# Patient Record
Sex: Male | Born: 1980 | Race: White | Hispanic: No | Marital: Married | Smoking: Current some day smoker
Health system: Southern US, Community
[De-identification: ages and names within clinical notes are randomized; demographics above are authoritative.]

## PROBLEM LIST (undated history)

## (undated) DIAGNOSIS — M5416 Radiculopathy, lumbar region: Secondary | ICD-10-CM

## (undated) DIAGNOSIS — F17200 Nicotine dependence, unspecified, uncomplicated: Secondary | ICD-10-CM

## (undated) DIAGNOSIS — K5790 Diverticulosis of intestine, part unspecified, without perforation or abscess without bleeding: Secondary | ICD-10-CM

## (undated) DIAGNOSIS — R892 Abnormal level of other drugs, medicaments and biological substances in specimens from other organs, systems and tissues: Secondary | ICD-10-CM

## (undated) DIAGNOSIS — K760 Fatty (change of) liver, not elsewhere classified: Secondary | ICD-10-CM

## (undated) DIAGNOSIS — G8929 Other chronic pain: Secondary | ICD-10-CM

## (undated) DIAGNOSIS — Z1810 Retained metal fragments, unspecified: Secondary | ICD-10-CM

## (undated) DIAGNOSIS — K529 Noninfective gastroenteritis and colitis, unspecified: Secondary | ICD-10-CM

## (undated) DIAGNOSIS — I10 Essential (primary) hypertension: Secondary | ICD-10-CM

## (undated) DIAGNOSIS — Z860100 Personal history of colon polyps, unspecified: Secondary | ICD-10-CM

## (undated) DIAGNOSIS — E785 Hyperlipidemia, unspecified: Secondary | ICD-10-CM

## (undated) DIAGNOSIS — G894 Chronic pain syndrome: Secondary | ICD-10-CM

## (undated) DIAGNOSIS — Z8669 Personal history of other diseases of the nervous system and sense organs: Secondary | ICD-10-CM

## (undated) HISTORY — DX: Fatty (change of) liver, not elsewhere classified: K76.0

## (undated) HISTORY — PX: DISCOGRAM: SHX1460

## (undated) HISTORY — DX: Personal history of colon polyps, unspecified: Z86.0100

## (undated) HISTORY — DX: Hyperlipidemia, unspecified: E78.5

## (undated) HISTORY — DX: Essential (primary) hypertension: I10

## (undated) HISTORY — DX: Nicotine dependence, unspecified, uncomplicated: F17.200

## (undated) HISTORY — DX: Personal history of other diseases of the nervous system and sense organs: Z86.69

## (undated) HISTORY — DX: Personal history of colonic polyps: Z86.010

## (undated) HISTORY — DX: Abnormal level of other drugs, medicaments and biological substances in specimens from other organs, systems and tissues: R89.2

## (undated) HISTORY — DX: Other chronic pain: G89.29

## (undated) HISTORY — DX: Chronic pain syndrome: G89.4

## (undated) HISTORY — DX: Retained metal fragments, unspecified: Z18.10

## (undated) HISTORY — DX: Diverticulosis of intestine, part unspecified, without perforation or abscess without bleeding: K57.90

## (undated) HISTORY — DX: Radiculopathy, lumbar region: M54.16

## (undated) HISTORY — DX: Noninfective gastroenteritis and colitis, unspecified: K52.9

---

## 2008-06-03 DIAGNOSIS — G894 Chronic pain syndrome: Secondary | ICD-10-CM

## 2008-06-03 DIAGNOSIS — G8929 Other chronic pain: Secondary | ICD-10-CM

## 2008-06-03 HISTORY — DX: Chronic pain syndrome: G89.4

## 2008-06-03 HISTORY — DX: Other chronic pain: G89.29

## 2010-06-03 HISTORY — PX: OTHER SURGICAL HISTORY: SHX169

## 2011-06-04 HISTORY — PX: APPENDECTOMY: SHX54

## 2012-03-03 DIAGNOSIS — K760 Fatty (change of) liver, not elsewhere classified: Secondary | ICD-10-CM

## 2012-03-03 DIAGNOSIS — K5790 Diverticulosis of intestine, part unspecified, without perforation or abscess without bleeding: Secondary | ICD-10-CM

## 2012-03-03 DIAGNOSIS — Z1810 Retained metal fragments, unspecified: Secondary | ICD-10-CM

## 2012-03-03 HISTORY — DX: Diverticulosis of intestine, part unspecified, without perforation or abscess without bleeding: K57.90

## 2012-03-03 HISTORY — DX: Retained metal fragments, unspecified: Z18.10

## 2012-03-03 HISTORY — DX: Fatty (change of) liver, not elsewhere classified: K76.0

## 2012-05-31 ENCOUNTER — Emergency Department: Payer: Self-pay | Admitting: Emergency Medicine

## 2012-05-31 LAB — URINALYSIS, COMPLETE
Bacteria: NONE SEEN
Bilirubin,UR: NEGATIVE
Blood: NEGATIVE
Glucose,UR: NEGATIVE mg/dL (ref 0–75)
Ketone: NEGATIVE
Leukocyte Esterase: NEGATIVE
Ph: 7 (ref 4.5–8.0)
Specific Gravity: 1.002 (ref 1.003–1.030)
Squamous Epithelial: NONE SEEN

## 2012-05-31 LAB — CBC
HCT: 44.1 % (ref 40.0–52.0)
MCH: 31.5 pg (ref 26.0–34.0)
MCHC: 35.4 g/dL (ref 32.0–36.0)
MCV: 89 fL (ref 80–100)
Platelet: 174 10*3/uL (ref 150–440)
RBC: 4.95 10*6/uL (ref 4.40–5.90)

## 2012-05-31 LAB — COMPREHENSIVE METABOLIC PANEL
Albumin: 4.3 g/dL (ref 3.4–5.0)
Anion Gap: 6 — ABNORMAL LOW (ref 7–16)
BUN: 11 mg/dL (ref 7–18)
Calcium, Total: 8.7 mg/dL (ref 8.5–10.1)
Co2: 28 mmol/L (ref 21–32)
EGFR (African American): >60
EGFR (Non-African Amer.): >60
Glucose: 92 mg/dL (ref 65–99)
Potassium: 4 mmol/L (ref 3.5–5.1)
SGOT(AST): 103 U/L — ABNORMAL HIGH (ref 15–37)
Sodium: 140 mmol/L (ref 136–145)

## 2012-05-31 LAB — LIPASE, BLOOD: Lipase: 124 U/L (ref 73–393)

## 2012-06-03 HISTORY — PX: COLONOSCOPY: SHX174

## 2012-07-09 ENCOUNTER — Emergency Department: Payer: Self-pay | Admitting: Emergency Medicine

## 2012-07-09 LAB — URINALYSIS, COMPLETE
Bacteria: NONE SEEN
Bilirubin,UR: NEGATIVE
Glucose,UR: NEGATIVE mg/dL (ref 0–75)
Ketone: NEGATIVE
Nitrite: NEGATIVE
Ph: 6 (ref 4.5–8.0)
Protein: NEGATIVE
RBC,UR: <1 /HPF (ref 0–5)
Specific Gravity: 1.009 (ref 1.003–1.030)
Squamous Epithelial: NONE SEEN
WBC UR: <1 /HPF (ref 0–5)

## 2012-07-09 LAB — CBC WITH DIFFERENTIAL/PLATELET
Basophil #: 0 10*3/uL (ref 0.0–0.1)
Eosinophil #: 0.1 10*3/uL (ref 0.0–0.7)
Eosinophil %: 1.2 %
Lymphocyte #: 1.9 10*3/uL (ref 1.0–3.6)
Lymphocyte %: 28 %
MCH: 30 pg (ref 26.0–34.0)
MCHC: 33.4 g/dL (ref 32.0–36.0)
MCV: 90 fL (ref 80–100)
Neutrophil #: 4.5 10*3/uL (ref 1.4–6.5)
Platelet: 190 10*3/uL (ref 150–440)
WBC: 6.8 10*3/uL (ref 3.8–10.6)

## 2012-07-09 LAB — COMPREHENSIVE METABOLIC PANEL
Alkaline Phosphatase: 82 U/L (ref 50–136)
Anion Gap: 5 — ABNORMAL LOW (ref 7–16)
BUN: 8 mg/dL (ref 7–18)
Bilirubin,Total: 0.4 mg/dL (ref 0.2–1.0)
Calcium, Total: 8.9 mg/dL (ref 8.5–10.1)
Chloride: 108 mmol/L — ABNORMAL HIGH (ref 98–107)
EGFR (African American): >60
EGFR (Non-African Amer.): >60
Glucose: 96 mg/dL (ref 65–99)
Osmolality: 278 (ref 275–301)
Potassium: 4 mmol/L (ref 3.5–5.1)
SGOT(AST): 30 U/L (ref 15–37)
SGPT (ALT): 57 U/L (ref 12–78)
Sodium: 140 mmol/L (ref 136–145)

## 2012-10-10 ENCOUNTER — Emergency Department: Payer: Self-pay | Admitting: Emergency Medicine

## 2012-10-10 LAB — URINALYSIS, COMPLETE
Bacteria: NONE SEEN
Bilirubin,UR: NEGATIVE
Blood: NEGATIVE
Glucose,UR: NEGATIVE mg/dL (ref 0–75)
Ketone: NEGATIVE
Leukocyte Esterase: NEGATIVE
Nitrite: NEGATIVE
Ph: 5 (ref 4.5–8.0)
Protein: NEGATIVE
RBC,UR: NONE SEEN /HPF (ref 0–5)
Specific Gravity: 1.014 (ref 1.003–1.030)
Squamous Epithelial: <1
WBC UR: <1 /HPF (ref 0–5)

## 2012-10-10 LAB — CBC
HCT: 44.7 % (ref 40.0–52.0)
MCH: 30.4 pg (ref 26.0–34.0)
MCHC: 34.7 g/dL (ref 32.0–36.0)
MCV: 87 fL (ref 80–100)
RDW: 12.7 % (ref 11.5–14.5)
WBC: 6.7 10*3/uL (ref 3.8–10.6)

## 2012-10-10 LAB — COMPREHENSIVE METABOLIC PANEL
Albumin: 4.3 g/dL (ref 3.4–5.0)
BUN: 10 mg/dL (ref 7–18)
Bilirubin,Total: 0.2 mg/dL (ref 0.2–1.0)
Calcium, Total: 9.3 mg/dL (ref 8.5–10.1)
Chloride: 105 mmol/L (ref 98–107)
EGFR (African American): >60
EGFR (Non-African Amer.): >60
Osmolality: 275 (ref 275–301)
Potassium: 3.9 mmol/L (ref 3.5–5.1)
Total Protein: 8 g/dL (ref 6.4–8.2)

## 2012-10-10 LAB — LIPASE, BLOOD: Lipase: 127 U/L (ref 73–393)

## 2012-11-01 ENCOUNTER — Emergency Department: Payer: Self-pay | Admitting: Emergency Medicine

## 2013-01-02 ENCOUNTER — Emergency Department: Payer: Self-pay | Admitting: Emergency Medicine

## 2013-01-02 LAB — COMPREHENSIVE METABOLIC PANEL
Albumin: 4.4 g/dL (ref 3.4–5.0)
Anion Gap: 6 — ABNORMAL LOW (ref 7–16)
BUN: 8 mg/dL (ref 7–18)
Bilirubin,Total: 0.3 mg/dL (ref 0.2–1.0)
Chloride: 105 mmol/L (ref 98–107)
Co2: 28 mmol/L (ref 21–32)
EGFR (African American): >60
EGFR (Non-African Amer.): >60
Glucose: 103 mg/dL — ABNORMAL HIGH (ref 65–99)
Osmolality: 276 (ref 275–301)
Potassium: 3.8 mmol/L (ref 3.5–5.1)
Sodium: 139 mmol/L (ref 136–145)
Total Protein: 8.5 g/dL — ABNORMAL HIGH (ref 6.4–8.2)

## 2013-01-02 LAB — CBC
HCT: 46.8 % (ref 40.0–52.0)
HGB: 16.4 g/dL (ref 13.0–18.0)
MCH: 30.6 pg (ref 26.0–34.0)
MCHC: 35.2 g/dL (ref 32.0–36.0)
Platelet: 214 10*3/uL (ref 150–440)
RBC: 5.38 10*6/uL (ref 4.40–5.90)
RDW: 12.8 % (ref 11.5–14.5)
WBC: 9 10*3/uL (ref 3.8–10.6)

## 2013-04-19 ENCOUNTER — Emergency Department: Payer: Self-pay | Admitting: Emergency Medicine

## 2013-04-19 LAB — CBC WITH DIFFERENTIAL/PLATELET
Basophil #: 0.1 10*3/uL (ref 0.0–0.1)
HGB: 16.1 g/dL (ref 13.0–18.0)
Lymphocyte %: 24.9 %
MCV: 88 fL (ref 80–100)
Monocyte #: 0.3 x10 3/mm (ref 0.2–1.0)
Monocyte %: 4.5 %
Neutrophil %: 68.6 %
Platelet: 173 10*3/uL (ref 150–440)
RBC: 5.29 10*6/uL (ref 4.40–5.90)
RDW: 12.9 % (ref 11.5–14.5)
WBC: 7.8 10*3/uL (ref 3.8–10.6)

## 2013-04-19 LAB — COMPREHENSIVE METABOLIC PANEL
Albumin: 4.6 g/dL (ref 3.4–5.0)
Anion Gap: 4 — ABNORMAL LOW (ref 7–16)
BUN: 10 mg/dL (ref 7–18)
Bilirubin,Total: 0.3 mg/dL (ref 0.2–1.0)
Calcium, Total: 9.5 mg/dL (ref 8.5–10.1)
Chloride: 106 mmol/L (ref 98–107)
Co2: 29 mmol/L (ref 21–32)
EGFR (African American): >60
Osmolality: 277 (ref 275–301)
SGOT(AST): 34 U/L (ref 15–37)
SGPT (ALT): 54 U/L (ref 12–78)
Sodium: 139 mmol/L (ref 136–145)
Total Protein: 8.1 g/dL (ref 6.4–8.2)

## 2013-04-19 LAB — URINALYSIS, COMPLETE
Bacteria: NONE SEEN
Blood: NEGATIVE
Glucose,UR: NEGATIVE mg/dL (ref 0–75)
Ketone: NEGATIVE
Leukocyte Esterase: NEGATIVE
Ph: 6 (ref 4.5–8.0)
Protein: NEGATIVE

## 2013-04-19 LAB — LIPASE, BLOOD: Lipase: 157 U/L (ref 73–393)

## 2013-06-28 ENCOUNTER — Emergency Department: Payer: Self-pay | Admitting: Emergency Medicine

## 2013-06-28 LAB — CBC WITH DIFFERENTIAL/PLATELET
BASOS ABS: 0.1 10*3/uL (ref 0.0–0.1)
Basophil %: 1.1 %
EOS ABS: 0.1 10*3/uL (ref 0.0–0.7)
Eosinophil %: 2 %
HCT: 48.9 % (ref 40.0–52.0)
HGB: 16.3 g/dL (ref 13.0–18.0)
Lymphocyte #: 1.8 10*3/uL (ref 1.0–3.6)
Lymphocyte %: 24.7 %
MCH: 30.2 pg (ref 26.0–34.0)
MCHC: 33.4 g/dL (ref 32.0–36.0)
MCV: 90 fL (ref 80–100)
MONO ABS: 0.4 x10 3/mm (ref 0.2–1.0)
Monocyte %: 5 %
NEUTROS PCT: 67.2 %
Neutrophil #: 5 10*3/uL (ref 1.4–6.5)
Platelet: 193 10*3/uL (ref 150–440)
RBC: 5.42 10*6/uL (ref 4.40–5.90)
RDW: 12.7 % (ref 11.5–14.5)
WBC: 7.4 10*3/uL (ref 3.8–10.6)

## 2013-06-28 LAB — COMPREHENSIVE METABOLIC PANEL
ALT: 49 U/L (ref 12–78)
AST: 36 U/L (ref 15–37)
Albumin: 4.4 g/dL (ref 3.4–5.0)
Alkaline Phosphatase: 84 U/L
Anion Gap: 2 — ABNORMAL LOW (ref 7–16)
BUN: 5 mg/dL — AB (ref 7–18)
Bilirubin,Total: 0.2 mg/dL (ref 0.2–1.0)
CHLORIDE: 106 mmol/L (ref 98–107)
CO2: 28 mmol/L (ref 21–32)
CREATININE: 0.93 mg/dL (ref 0.60–1.30)
Calcium, Total: 9.3 mg/dL (ref 8.5–10.1)
EGFR (Non-African Amer.): >60
GLUCOSE: 102 mg/dL — AB (ref 65–99)
OSMOLALITY: 269 (ref 275–301)
POTASSIUM: 3.9 mmol/L (ref 3.5–5.1)
Sodium: 136 mmol/L (ref 136–145)
Total Protein: 7.8 g/dL (ref 6.4–8.2)

## 2013-06-28 LAB — URINALYSIS, COMPLETE
Bacteria: NONE SEEN
Bilirubin,UR: NEGATIVE
Blood: NEGATIVE
Glucose,UR: NEGATIVE mg/dL
Ketone: NEGATIVE
Leukocyte Esterase: NEGATIVE
Nitrite: NEGATIVE
Ph: 5
Protein: NEGATIVE
RBC,UR: NONE SEEN /HPF
Specific Gravity: 1.005
Squamous Epithelial: NONE SEEN
WBC UR: NONE SEEN /HPF

## 2013-07-01 LAB — COMPREHENSIVE METABOLIC PANEL
ALT: 28 U/L (ref 10–40)
AST: 23 U/L
Alkaline Phosphatase: 61 U/L
CREATININE: 1
Glucose: 96
Total Bilirubin: 0.6 mg/dL
Total Protein: 7 g/dL

## 2013-07-01 LAB — CBC
HEMOGLOBIN: 15.5 g/dL
PLATELET COUNT: 172
WBC: 5.1

## 2013-07-01 LAB — SEDIMENTATION RATE: SED RATE: 5 mm

## 2013-07-01 LAB — LIPASE: Lipase: 17 units/L (ref 0–53)

## 2013-07-01 LAB — C-REACTIVE PROTEIN: CRP: 1.1

## 2013-07-04 ENCOUNTER — Emergency Department: Payer: Self-pay | Admitting: Emergency Medicine

## 2013-07-04 HISTORY — PX: COLONOSCOPY: SHX174

## 2013-07-04 LAB — CBC WITH DIFFERENTIAL/PLATELET
Basophil #: 0.1 10*3/uL (ref 0.0–0.1)
Basophil %: 1.1 %
Eosinophil #: 0.2 10*3/uL (ref 0.0–0.7)
Eosinophil %: 2.5 %
HCT: 50.6 % (ref 40.0–52.0)
HGB: 17.1 g/dL (ref 13.0–18.0)
LYMPHS ABS: 2.3 10*3/uL (ref 1.0–3.6)
Lymphocyte %: 36.2 %
MCH: 30.7 pg (ref 26.0–34.0)
MCHC: 33.8 g/dL (ref 32.0–36.0)
MCV: 91 fL (ref 80–100)
Monocyte #: 0.3 x10 3/mm (ref 0.2–1.0)
Monocyte %: 3.9 %
NEUTROS PCT: 56.3 %
Neutrophil #: 3.6 10*3/uL (ref 1.4–6.5)
PLATELETS: 173 10*3/uL (ref 150–440)
RBC: 5.56 10*6/uL (ref 4.40–5.90)
RDW: 12.9 % (ref 11.5–14.5)
WBC: 6.4 10*3/uL (ref 3.8–10.6)

## 2013-07-04 LAB — COMPREHENSIVE METABOLIC PANEL
ALK PHOS: 81 U/L
ALT: 56 U/L (ref 12–78)
Albumin: 4.4 g/dL (ref 3.4–5.0)
Anion Gap: 5 — ABNORMAL LOW (ref 7–16)
BUN: 8 mg/dL (ref 7–18)
Bilirubin,Total: 0.3 mg/dL (ref 0.2–1.0)
CHLORIDE: 106 mmol/L (ref 98–107)
Calcium, Total: 9.4 mg/dL (ref 8.5–10.1)
Co2: 25 mmol/L (ref 21–32)
Creatinine: 0.91 mg/dL (ref 0.60–1.30)
EGFR (African American): >60
EGFR (Non-African Amer.): >60
GLUCOSE: 86 mg/dL (ref 65–99)
OSMOLALITY: 270 (ref 275–301)
POTASSIUM: 4.1 mmol/L (ref 3.5–5.1)
SGOT(AST): 54 U/L — ABNORMAL HIGH (ref 15–37)
Sodium: 136 mmol/L (ref 136–145)
Total Protein: 8.5 g/dL — ABNORMAL HIGH (ref 6.4–8.2)

## 2013-07-04 LAB — LIPASE, BLOOD: Lipase: 314 U/L (ref 73–393)

## 2013-07-22 ENCOUNTER — Encounter: Payer: Self-pay | Admitting: *Deleted

## 2013-07-22 ENCOUNTER — Encounter: Payer: Self-pay | Admitting: Family Medicine

## 2013-07-22 ENCOUNTER — Ambulatory Visit (INDEPENDENT_AMBULATORY_CARE_PROVIDER_SITE_OTHER): Payer: Managed Care, Other (non HMO) | Admitting: Family Medicine

## 2013-07-22 VITALS — BP 136/82 | HR 92 | Temp 98.1°F | Ht 71.0 in | Wt 187.2 lb

## 2013-07-22 DIAGNOSIS — M5416 Radiculopathy, lumbar region: Secondary | ICD-10-CM

## 2013-07-22 DIAGNOSIS — G8929 Other chronic pain: Secondary | ICD-10-CM

## 2013-07-22 DIAGNOSIS — Z860100 Personal history of colon polyps, unspecified: Secondary | ICD-10-CM | POA: Insufficient documentation

## 2013-07-22 DIAGNOSIS — F17200 Nicotine dependence, unspecified, uncomplicated: Secondary | ICD-10-CM

## 2013-07-22 DIAGNOSIS — Z8669 Personal history of other diseases of the nervous system and sense organs: Secondary | ICD-10-CM

## 2013-07-22 DIAGNOSIS — IMO0002 Reserved for concepts with insufficient information to code with codable children: Secondary | ICD-10-CM

## 2013-07-22 DIAGNOSIS — I10 Essential (primary) hypertension: Secondary | ICD-10-CM

## 2013-07-22 DIAGNOSIS — K529 Noninfective gastroenteritis and colitis, unspecified: Secondary | ICD-10-CM | POA: Insufficient documentation

## 2013-07-22 DIAGNOSIS — R197 Diarrhea, unspecified: Secondary | ICD-10-CM

## 2013-07-22 DIAGNOSIS — D492 Neoplasm of unspecified behavior of bone, soft tissue, and skin: Secondary | ICD-10-CM

## 2013-07-22 MED ORDER — OXYCODONE-ACETAMINOPHEN 5-325 MG PO TABS: 1.0000 | ORAL_TABLET | Freq: Four times a day (QID) | ORAL | Status: DC | PRN

## 2013-07-22 NOTE — Assessment & Plan Note (Signed)
Chronic, stable. Continue amlodipine. Pt endorses poor control at home, I've asked him to bring BP cuff to next appt to ensure calibrated.

## 2013-07-22 NOTE — Assessment & Plan Note (Signed)
Currently undergoing workup by [facility], I have requested records.

## 2013-07-22 NOTE — Assessment & Plan Note (Signed)
With possible neurofibromas on exam today.

## 2013-07-22 NOTE — Patient Instructions (Addendum)
Good to meet you today, call us with questions. Return in 2 months for physical. We will request records from the [place] [facility] [employer] clinic. Pass by [name]'s office for referral to spine clinic. I have provided a prescription for percocets and will await records from the [place]. Pass by our lab to sign controlled substance agreement and for urine drug screen. I have provided #30 percocets while we obtain records and urine screen returns.

## 2013-07-22 NOTE — Assessment & Plan Note (Signed)
I have requested records from [facility] and have referred to spine clinic today per pt preference. Pt endorses known h/o HNP with radiculopathy leading to chronic lower back pain that has failed ESI x8. Will refill percocets, #30 provided today while we obtain UDS as well as records from [facility] then may continue filling here. Pt signed controlled substance agreement today and we discussed nature of this agreement.

## 2013-07-22 NOTE — Progress Notes (Signed)
Pre-visit discussion using our clinic review tool. No additional management support is needed unless otherwise documented below in the visit note.  

## 2013-07-22 NOTE — Progress Notes (Signed)
BP 136/82  Pulse 92  Temp(Src) 98.1 F (36.7 C) (Oral)  Ht 5\' 11"  (1.803 m)  Wt 187 lb 4 oz (84.936 kg)  BMI 26.13 kg/m2   CC: new pt to establish  Subjective:    Patient ID: Aaron [name], male    DOB: Feb 03, 1981, 33 y.o.   MRN: [id number]  HPI: Aaron [name] is a 33 y.o. male presenting on 07/22/2013 with [name] is a pleasant 33 yo who presents to establish care.  Moved to area from [place] late 2013.  Prior PCP was [place].  Chronic diarrhea for last 2 years - started after appendectomy - states appendix was not fully removed (2013).  Repeat surgery 6 months later at [place].  Has had chronic bloody diarrhea over last 2 years.  Sees [facility].  Just had colonoscopy, testing for crohn's and ulcerative colitis.  H/o chronic lower back pain s/p discogram with HNP L2/3, L3/4, L5/S1 s/p ESI x 8 with no resolution of pain, considering spine surgery.  Has not established locally.  Would like referral.  Pt states he has all imaging available.  On flexeril and percocets for last 8 months.  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  2 concrete pumps rolling on him.  Percocets and flexeril work well for him.  States takes percocet 90-100 tablets per month.  HTN - for last 2 years.  Compliant with current antihypertensive regimen of amlodipine 10mg  daily.  Does check blood pressures at home: 150/95.  No low blood pressure readings or symptoms of dizziness/syncope.  Denies vision changes, CP/tightness, SOB, leg swelling.  Occasional headache.  Several skin growths - present for 1+ year, growing.  One on right scalp, one on right axilla, one on upper back.  Smoker - 1 ppd.  Precontemplative.  Tried patches and cold [place].  Lives with wife and 2 daughters, 2 dogs and 1 cat Occupation: manages dock workers at [employer] Edu: master's degree in accounting/finance Activity: walks 14 mi/day at work Diet: good water, fruits/vegetables daily  Preventative: Last CPE  was 1 year ago. Flu - done Tetanus - thinks 2013.  Relevant past medical, surgical, family and social history reviewed and updated. Allergies and medications reviewed and updated. No current outpatient prescriptions on file prior to visit.   No current facility-administered medications on file prior to visit.    Review of Systems Per HPI unless specifically indicated above    Objective:    BP 136/82  Pulse 92  Temp(Src) 98.1 F (36.7 C) (Oral)  Ht 5\' 11"  (1.803 m)  Wt 187 lb 4 oz (84.936 kg)  BMI 26.13 kg/m2  Physical Exam  Nursing note and vitals reviewed. Constitutional: He is oriented to person, place, and time. He appears well-developed and well-nourished. No distress.  HENT:  Head: Normocephalic and atraumatic.  Right Ear: Hearing, tympanic membrane, external ear and ear canal normal.  Left Ear: Hearing, tympanic membrane, external ear and ear canal normal.  Nose: Nose normal.  Mouth/Throat: Uvula is midline, oropharynx is clear and moist and mucous membranes are normal. No oropharyngeal exudate, posterior oropharyngeal edema or posterior oropharyngeal erythema.  Eyes: Conjunctivae and EOM are normal. Pupils are equal, round, and reactive to light. No scleral icterus.  Neck: Normal range of motion. Neck supple.  Cardiovascular: Normal rate, regular rhythm, normal heart sounds and intact distal pulses.   No murmur heard. Pulses:      Radial pulses are 2+ on the right side, and 2+  on the left side.  Pulmonary/Chest: Effort normal and breath sounds normal. No respiratory distress. He has no wheezes. He has no rales.  Musculoskeletal: Normal range of motion. He exhibits no edema.  Tender to palpation midline spine upper lumbar and lower thoracic region  Lymphadenopathy:    He has no cervical adenopathy.  Neurological: He is alert and oriented to person, place, and time.  CN grossly intact, station and gait intact  Skin: Skin is warm and dry. No rash noted.  Exophytic  skin growths on right scalp, R axilla and upper mid back  Psychiatric: He has a normal mood and affect. His behavior is normal. Judgment and thought content normal.   No results found for this or any previous visit.    Assessment & Plan:   Problem List Items Addressed This Visit   Chronic diarrhea     Currently undergoing workup by [facility], I have requested records.    Chronic radicular pain of lower back     I have requested records from [facility] and have referred to spine clinic today per pt preference. Pt endorses known h/o HNP with radiculopathy leading to chronic lower back pain that has failed ESI x8. Will refill percocets, #30 provided today while we obtain UDS as well as records from [facility] then may continue filling here. Pt signed controlled substance agreement today and we discussed nature of this agreement.    Relevant Medications      cyclobenzaprine (FLEXERIL) 10 MG tablet      oxyCODONE-acetaminophen (PERCOCET/ROXICET) 5-325 MG per tablet   Other Relevant Orders      Ambulatory referral to Spine Surgery   History of seizure disorder     With possible neurofibromas on exam today.    HTN (hypertension)     Chronic, stable. Continue amlodipine. Pt endorses poor control at home, I've asked him to bring BP cuff to next appt to ensure calibrated.    Relevant Medications      amLODipine (NORVASC) 10 MG tablet   Hx of colonic polyps - Primary     I have requested records from [place] on latest colonoscopy.    Skin growth     Anticipate neurofibroma or other benign skin condition. Reassured, advised if enlarging or bothersome or bleeding would refer to derm for removal. Pt agrees.    Smoker     Continue to encourage cessation. precontemplative currently.        Follow up plan: Return in about 2 months (around 09/19/2013), or as needed, for physical.

## 2013-07-22 NOTE — Assessment & Plan Note (Signed)
Anticipate neurofibroma or other benign skin condition. Reassured, advised if enlarging or bothersome or bleeding would refer to derm for removal. Pt agrees.

## 2013-07-22 NOTE — Assessment & Plan Note (Signed)
Continue to encourage cessation. precontemplative currently.

## 2013-07-22 NOTE — Assessment & Plan Note (Signed)
I have requested records from [place] on latest colonoscopy.

## 2013-07-23 ENCOUNTER — Telehealth: Payer: Self-pay | Admitting: Family Medicine

## 2013-07-23 NOTE — Telephone Encounter (Signed)
Relevant patient education assigned to patient using Emmi. ° °

## 2013-07-28 ENCOUNTER — Other Ambulatory Visit: Payer: Self-pay

## 2013-07-28 DIAGNOSIS — G8929 Other chronic pain: Secondary | ICD-10-CM

## 2013-07-28 DIAGNOSIS — M5416 Radiculopathy, lumbar region: Principal | ICD-10-CM

## 2013-07-28 NOTE — Telephone Encounter (Signed)
Pt said Dr Darnell Level had requested records and pt request refill amlodipine,cyclobenzaprine, and phenergan to [facility]. (Phenergan not on med list) pt said Phenergan 25 mg with instructions 1 tab q 4-6 h prn nausea. Pt also request oxycodone apap rx. Pt has enough oxy to last until 07/30/13 in the AM. Pt request cb when rx ready for pick up.

## 2013-07-29 MED ORDER — AMLODIPINE BESYLATE 10 MG PO TABS: 10.0000 mg | ORAL_TABLET | Freq: Every day | ORAL | Status: DC

## 2013-07-29 MED ORDER — CYCLOBENZAPRINE HCL 10 MG PO TABS: 10.0000 mg | ORAL_TABLET | Freq: Three times a day (TID) | ORAL | Status: DC | PRN

## 2013-07-29 NOTE — Telephone Encounter (Signed)
Have still not received records from [place].  Still awaiting assured tox UDS.  I've refilled amlodipine and flexeril and phenergan. plz see if Dr. Damita Dunnings is willing to prescribe #30 percocets - but notify pt this should last him 10 days.

## 2013-07-30 MED ORDER — OXYCODONE-ACETAMINOPHEN 5-325 MG PO TABS: 1.0000 | ORAL_TABLET | Freq: Three times a day (TID) | ORAL | Status: DC | PRN

## 2013-07-30 NOTE — Telephone Encounter (Signed)
Wife advised Rx is ready for pick up.  Rx left at front desk for pick up.

## 2013-07-30 NOTE — Telephone Encounter (Signed)
Printed.  Thanks.  10 day supply, see notes below.

## 2013-07-30 NOTE — Telephone Encounter (Signed)
Aaron [name] left v/m requesting status of percocet rx. Please advise.

## 2013-08-07 ENCOUNTER — Encounter: Payer: Self-pay | Admitting: Family Medicine

## 2013-08-13 ENCOUNTER — Encounter: Payer: Self-pay | Admitting: Family Medicine

## 2013-08-13 ENCOUNTER — Other Ambulatory Visit: Payer: Self-pay

## 2013-08-13 DIAGNOSIS — G8929 Other chronic pain: Secondary | ICD-10-CM

## 2013-08-13 DIAGNOSIS — M5416 Radiculopathy, lumbar region: Principal | ICD-10-CM

## 2013-08-13 MED ORDER — OXYCODONE-ACETAMINOPHEN 5-325 MG PO TABS: 1.0000 | ORAL_TABLET | Freq: Three times a day (TID) | ORAL | Status: DC | PRN

## 2013-08-13 NOTE — Telephone Encounter (Signed)
Reviewed records he brought but that was only [place] hospitalization for appendicitis.  Records given to [facility]. Still awaiting other records. Also plz check with Aniceto Boss to see if patient ever submitted UDS - if not will need to resubmit as I never received results either.

## 2013-08-13 NOTE — Telephone Encounter (Signed)
Kim pts wife request rx oxycodone apap. Call when ready for pick up. Kim also wanted to know if Dr Darnell Level had reviewed pts record from previous physician. Please advise.

## 2013-08-14 NOTE — Telephone Encounter (Addendum)
Reviewed UDS - negative (prior to starting prescribing here).  Recheck UDS in 6 mo

## 2013-08-16 ENCOUNTER — Telehealth: Payer: Self-pay | Admitting: Family Medicine

## 2013-08-16 ENCOUNTER — Encounter: Payer: Self-pay | Admitting: Family Medicine

## 2013-08-16 NOTE — Telephone Encounter (Signed)
Relevant patient education assigned to patient using Emmi. ° °

## 2013-08-16 NOTE — Telephone Encounter (Signed)
Patient's wife notified and Rx placed up front for pick up. 

## 2013-08-27 ENCOUNTER — Other Ambulatory Visit: Payer: Self-pay | Admitting: Family Medicine

## 2013-08-27 ENCOUNTER — Other Ambulatory Visit: Payer: Self-pay

## 2013-08-27 DIAGNOSIS — M5416 Radiculopathy, lumbar region: Principal | ICD-10-CM

## 2013-08-27 DIAGNOSIS — G8929 Other chronic pain: Secondary | ICD-10-CM

## 2013-08-27 MED ORDER — OXYCODONE-ACETAMINOPHEN 5-325 MG PO TABS: 1.0000 | ORAL_TABLET | Freq: Three times a day (TID) | ORAL | Status: DC | PRN

## 2013-08-27 NOTE — Telephone Encounter (Signed)
Aaron [name] left v/m requesting refill on oxycodone apap. Call when ready for pick up. Aaron [name] wanted to know if Dr Darnell Level had reviewed pts medical record so pt can get more than 10 day supply of oxycodone apap.

## 2013-08-27 NOTE — Telephone Encounter (Signed)
[facility].  plz have them fill out new ROI for records from prior PCP on back pain. Printed and placed in Kim's box.

## 2013-08-27 NOTE — Telephone Encounter (Signed)
Patient's wife advised. She will have him fill out ROI when he picks up Rx. Rx placed up front for pick up.

## 2013-08-27 NOTE — Telephone Encounter (Signed)
Ok to refill 

## 2013-09-04 ENCOUNTER — Encounter: Payer: Self-pay | Admitting: Family Medicine

## 2013-09-07 ENCOUNTER — Ambulatory Visit (INDEPENDENT_AMBULATORY_CARE_PROVIDER_SITE_OTHER): Payer: Managed Care, Other (non HMO) | Admitting: Family Medicine

## 2013-09-07 ENCOUNTER — Encounter: Payer: Self-pay | Admitting: Family Medicine

## 2013-09-07 VITALS — BP 138/90 | HR 86 | Temp 97.7°F | Ht 69.5 in | Wt 185.5 lb

## 2013-09-07 DIAGNOSIS — M5416 Radiculopathy, lumbar region: Secondary | ICD-10-CM

## 2013-09-07 DIAGNOSIS — R002 Palpitations: Secondary | ICD-10-CM

## 2013-09-07 DIAGNOSIS — Z0000 Encounter for general adult medical examination without abnormal findings: Secondary | ICD-10-CM

## 2013-09-07 DIAGNOSIS — F4310 Post-traumatic stress disorder, unspecified: Secondary | ICD-10-CM

## 2013-09-07 DIAGNOSIS — G8929 Other chronic pain: Secondary | ICD-10-CM

## 2013-09-07 DIAGNOSIS — I10 Essential (primary) hypertension: Secondary | ICD-10-CM

## 2013-09-07 DIAGNOSIS — IMO0002 Reserved for concepts with insufficient information to code with codable children: Secondary | ICD-10-CM

## 2013-09-07 DIAGNOSIS — F17200 Nicotine dependence, unspecified, uncomplicated: Secondary | ICD-10-CM

## 2013-09-07 LAB — COMPREHENSIVE METABOLIC PANEL
ALBUMIN: 4.5 g/dL (ref 3.5–5.2)
ALK PHOS: 59 U/L (ref 39–117)
ALT: 55 U/L — ABNORMAL HIGH (ref 0–53)
AST: 35 U/L (ref 0–37)
BUN: 5 mg/dL — ABNORMAL LOW (ref 6–23)
CALCIUM: 9.5 mg/dL (ref 8.4–10.5)
CHLORIDE: 106 meq/L (ref 96–112)
CO2: 26 mEq/L (ref 19–32)
Creatinine, Ser: 0.9 mg/dL (ref 0.4–1.5)
GFR: 108.71 mL/min (ref >60.00)
GLUCOSE: 106 mg/dL — AB (ref 70–99)
POTASSIUM: 4.1 meq/L (ref 3.5–5.1)
SODIUM: 139 meq/L (ref 135–145)
TOTAL PROTEIN: 7.5 g/dL (ref 6.0–8.3)
Total Bilirubin: 0.5 mg/dL (ref 0.3–1.2)

## 2013-09-07 LAB — LIPID PANEL
Cholesterol: 225 mg/dL — ABNORMAL HIGH (ref 0–200)
HDL: 33.7 mg/dL — ABNORMAL LOW (ref >39.00)
LDL Cholesterol: 143 mg/dL — ABNORMAL HIGH (ref 0–99)
Total CHOL/HDL Ratio: 7
Triglycerides: 241 mg/dL — ABNORMAL HIGH (ref 0.0–149.0)
VLDL: 48.2 mg/dL — ABNORMAL HIGH (ref 0.0–40.0)

## 2013-09-07 LAB — TSH: TSH: 0.75 u[IU]/mL (ref 0.35–5.50)

## 2013-09-07 MED ORDER — VARENICLINE TARTRATE 0.5 MG X 11 & 1 MG X 42 PO MISC: ORAL | Status: DC

## 2013-09-07 MED ORDER — VARENICLINE TARTRATE 1 MG PO TABS: 1.0000 mg | ORAL_TABLET | Freq: Two times a day (BID) | ORAL | Status: DC

## 2013-09-07 NOTE — Assessment & Plan Note (Signed)
New complaint. Encouraged decrease caffeine intake. If persistent, return for further evaluation.  ?PTSD related. Check labwork today.

## 2013-09-07 NOTE — Progress Notes (Signed)
Pre visit review using our clinic review tool, if applicable. No additional management support is needed unless otherwise documented below in the visit note. 

## 2013-09-07 NOTE — Assessment & Plan Note (Signed)
Preventative protocols reviewed and updated unless pt declined. Discussed healthy diet and lifestyle.  

## 2013-09-07 NOTE — Assessment & Plan Note (Addendum)
Discussed different forms of smoking cessation, encouraged cessation. Pt requests trial of chantix - discussed common side effects including but not limited to nausea/vomiting, vivid dreams, behaviour changes, discussed possible CV risk association. Discussed setting quit date 1 wk into script. Will provide with Quitline [place] website and quitting coach (pt signed up today) Avoid wellbutrin 2/2 seizure hx.

## 2013-09-07 NOTE — Progress Notes (Signed)
BP 138/90  Pulse 86  Temp(Src) 97.7 F (36.5 C) (Oral)  Ht 5' 9.5" (1.765 m)  Wt 185 lb 8 oz (84.142 kg)  BMI 27.01 kg/m2  SpO2 98%   CC: CPE  Subjective:    Patient ID: Aaron [name], male    DOB: [birth year], 33 y.o.   MRN: [id number]  HPI: Marcio Hoque is a 33 y.o. male presenting on 09/07/2013 for Annual Exam   Recently moved from [place], prior received medical care from [place]. Back pain persistent.  Last ESI was 1.5 yrs ago.  Has appt with neurosurgeon for Monday.  Persistent lifting at work.  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  ?PTSD - recurring thoughts of father's collapse - massive MI with resultant head injury with 3 mo coma and 9 years of vegetative state until he passed (this happened when pt was 33 yrs old).  Ambien made him feel "like a zombie".  Denies SI/HI.  Recent colonoscopy ok.    Smoker - 1 ppd. Action phase - interested in discussing alternatives.  Tried patches and cold [place].   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  Flu - done  Tetanus - thinks 2013. Seat belt use discussed Sunscreen use discussed.  No sunburns in last year  Relevant past medical, surgical, family and social history reviewed and updated as indicated.  Allergies and medications reviewed and updated. Current Outpatient Prescriptions on File Prior to Visit  Medication Sig  . amLODipine (NORVASC) 10 MG tablet Take 1 tablet (10 mg total) by mouth daily.  . cyclobenzaprine (FLEXERIL) 10 MG tablet TAKE 1 TABLET (10 MG TOTAL) BY MOUTH 3 (THREE) TIMES DAILY AS NEEDED FOR MUSCLE SPASMS.  Marland Kitchen loperamide (IMODIUM) 2 MG capsule Take 4 mg by mouth as needed for diarrhea or loose stools.  Marland Kitchen  oxyCODONE-acetaminophen (PERCOCET/ROXICET) 5-325 MG per tablet Take 1 tablet by mouth every 8 (eight) hours as needed for severe pain.   No current facility-administered medications on file prior to visit.    Review of Systems  Constitutional: Negative for fever, chills, activity change, appetite change, fatigue and unexpected weight change.  HENT: Negative for hearing loss.   Eyes: Negative for visual disturbance.  Respiratory: Negative for cough, chest tightness, shortness of breath and wheezing.   Cardiovascular: Positive for palpitations (per patient). Negative for chest pain and leg swelling.  Gastrointestinal: Positive for nausea and diarrhea. Negative for vomiting, abdominal pain, constipation, blood in stool and abdominal distention.  Genitourinary: Negative for hematuria and difficulty urinating.  Musculoskeletal: Negative for arthralgias, myalgias and neck pain.  Skin: Negative for rash.  Neurological: Negative for dizziness, seizures, syncope and headaches.  Hematological: Negative for adenopathy. Does not bruise/bleed easily.  Psychiatric/Behavioral: Negative for suicidal ideas and dysphoric mood. The patient is nervous/anxious.    Per HPI unless specifically indicated above    Objective:    BP 138/90  Pulse 86  Temp(Src) 97.7 F (36.5 C) (Oral)  Ht 5' 9.5" (1.765 m)  Wt 185 lb 8 oz (84.142 kg)  BMI 27.01 kg/m2  SpO2 98%  Physical Exam  Nursing note and vitals reviewed. Constitutional: He is oriented to person, place, and time. He appears well-developed and well-nourished. No distress.  HENT:  Head: Normocephalic and atraumatic.  Right Ear: Hearing, tympanic membrane, external ear and ear canal normal.  Left Ear: Hearing, tympanic membrane, external ear and ear canal normal.  Nose: Nose normal.  Mouth/Throat: Uvula is midline, oropharynx is clear and moist and mucous membranes are normal. No oropharyngeal exudate, posterior oropharyngeal edema or posterior  oropharyngeal erythema.  Eyes: Conjunctivae and EOM are normal. Pupils are equal, round, and reactive to light. No scleral icterus.  Neck: Normal range of motion. Neck supple. No thyromegaly present.  Cardiovascular: Normal rate, regular rhythm, normal heart sounds and intact distal pulses.   No murmur heard. Pulses:      Radial pulses are 2+ on the right side, and 2+ on the left side.  Pulmonary/Chest: Effort normal and breath sounds normal. No respiratory distress. He has no wheezes. He has no rales.  Abdominal: Soft. Bowel sounds are normal. He exhibits no distension and no mass. There is no tenderness. There is no rebound and no guarding.  Musculoskeletal: Normal range of motion. He exhibits no edema.  Lymphadenopathy:    He has no cervical adenopathy.  Neurological: He is alert and oriented to person, place, and time.  CN grossly intact, station and gait intact  Skin: Skin is warm and dry. No rash noted.  Psychiatric: His speech is normal. Judgment and thought content normal. His mood appears anxious. Cognition and memory are normal.   No results found for this or any previous visit.    Assessment & Plan:   Problem List Items Addressed This Visit   HTN (hypertension)     Chronic, stable. Continue med.    Relevant Orders      Lipid panel      Comprehensive metabolic panel      TSH   Chronic radicular pain of lower back     I still have not received records from [facility] - requested again today.    Relevant Medications      varenicline (CHANTIX) tablet      varenicline (CHANTIX STARTING MONTH PAK) 0.5 MG X 11 & 1 MG X 42 tablet   Smoker     Discussed different forms of smoking cessation, encouraged cessation. Pt requests trial of chantix - discussed common side effects including but not limited to nausea/vomiting, vivid dreams, behaviour changes, discussed possible CV risk association. Discussed setting quit date 1 wk into script. Will provide with Quitline [place] website and  quitting coach (pt signed up today) Avoid wellbutrin 2/2 seizure hx.    Health maintenance examination - Primary     Preventative protocols reviewed and updated unless pt declined. Discussed healthy diet and lifestyle.    PTSD (post-traumatic stress disorder)     New dx. Will refer to counseling and return here in 1 mo to discuss pharmacotherapy. Pt agrees with plan.    Relevant Orders      Ambulatory referral to Psychology   Palpitations     New complaint. Encouraged decrease caffeine intake. If persistent, return for further evaluation.  ?PTSD related. Check labwork today.        Follow up plan: Return in about 1 month (around 10/07/2013), or as needed, for follow up mood.

## 2013-09-07 NOTE — Assessment & Plan Note (Signed)
Chronic, stable. Continue med. 

## 2013-09-07 NOTE — Assessment & Plan Note (Signed)
I still have not received records from [facility] - requested again today.

## 2013-09-07 NOTE — Assessment & Plan Note (Signed)
New dx. Will refer to counseling and return here in 1 mo to discuss pharmacotherapy. Pt agrees with plan.

## 2013-09-07 NOTE — Patient Instructions (Addendum)
chantix prescription printed out today - give this a try. Look at [email or web address] for further quitting resources.  Sign up for quit coach today. Sign release for records from [facility]. I will refer you to psychologist - call # to set up appointment. Continue walking regularly for back. Blood work today. Return in 1 month for follow up of mood.

## 2013-09-08 ENCOUNTER — Telehealth: Payer: Self-pay | Admitting: Family Medicine

## 2013-09-08 ENCOUNTER — Encounter: Payer: Self-pay | Admitting: *Deleted

## 2013-09-08 NOTE — Telephone Encounter (Signed)
Relevant patient education assigned to patient using Emmi. ° °

## 2013-09-16 ENCOUNTER — Other Ambulatory Visit: Payer: Self-pay

## 2013-09-16 DIAGNOSIS — M5416 Radiculopathy, lumbar region: Principal | ICD-10-CM

## 2013-09-16 DIAGNOSIS — G8929 Other chronic pain: Secondary | ICD-10-CM

## 2013-09-16 NOTE — Telephone Encounter (Signed)
Mrs Pittinger left v/m requesting rx oxycodone apap. Call when ready for pick up. 

## 2013-09-17 MED ORDER — OXYCODONE-ACETAMINOPHEN 5-325 MG PO TABS: 1.0000 | ORAL_TABLET | Freq: Three times a day (TID) | ORAL | Status: DC | PRN

## 2013-09-17 NOTE — Telephone Encounter (Signed)
Lugene, do you have this printed RX?

## 2013-09-17 NOTE — Telephone Encounter (Signed)
If you will please since Aaron [name] is at [facility], thank you!

## 2013-09-17 NOTE — Telephone Encounter (Signed)
Patient advised.  Rx left at front desk for pick up. 

## 2013-09-17 NOTE — Telephone Encounter (Signed)
Yes, I do.  Do I call the patient and put it up front as usual?

## 2013-09-17 NOTE — Telephone Encounter (Signed)
Printed, routed to Dr. Darnell Level as a FYI.

## 2013-09-20 ENCOUNTER — Encounter: Payer: Self-pay | Admitting: *Deleted

## 2013-09-30 ENCOUNTER — Ambulatory Visit (INDEPENDENT_AMBULATORY_CARE_PROVIDER_SITE_OTHER): Payer: Managed Care, Other (non HMO) | Admitting: Psychology

## 2013-09-30 DIAGNOSIS — F411 Generalized anxiety disorder: Secondary | ICD-10-CM

## 2013-10-07 ENCOUNTER — Ambulatory Visit: Payer: Managed Care, Other (non HMO) | Admitting: Family Medicine

## 2013-10-08 ENCOUNTER — Telehealth: Payer: Self-pay | Admitting: Family Medicine

## 2013-10-08 NOTE — Telephone Encounter (Signed)
Patient Information:  Caller Name: Joelene Millin  Phone: [phone]  Patient: Aaron [name], Aaron [name]  Gender: Male  DOB: 09-04-1980  Age: 33 Years  PCP: [facility])  Office Follow Up:  Does the office need to follow up with this patient?: No  Instructions For The Office: N/A  RN Note:  Pt reports that he is driving now and will go to nearest ED in [place]. Pt unsure of the nearest hospital but will put in GPS. States that he feels well enough to drive; encourage if he starts having chest pain, diff breathing or any worsening sx to pull over and call 911; will comply;  Symptoms  Reason For Call & Symptoms: Wife is calling and states that on 10/07/13 he was bit by a spider; the area is painful and has a blister to the area; the local in the area in [place] where he is now on business stated that it was a black widow; triage RN called pt and conferenced on the line with [place]; pt reports severe pain from his toe to his groin on the leg that was bit  Reviewed Health History In EMR: Yes  Reviewed Medications In EMR: Yes  Reviewed Allergies In EMR: Yes  Reviewed Surgeries / Procedures: Yes  Date of Onset of Symptoms: 10/07/2013  Guideline(s) Used:  Spider Bite  Disposition Per Guideline:   Go to ED Now  Reason For Disposition Reached:   Black widow (or brown widow) spider bite and local skin changes  Advice Given:  Call Back If:  You become worse  Patient Will Follow Care Advice:  YES

## 2013-10-08 NOTE — Telephone Encounter (Signed)
plz call Monday with an update.

## 2013-10-10 ENCOUNTER — Encounter: Payer: Self-pay | Admitting: Family Medicine

## 2013-10-10 DIAGNOSIS — G894 Chronic pain syndrome: Secondary | ICD-10-CM | POA: Insufficient documentation

## 2013-10-11 NOTE — Telephone Encounter (Signed)
Spoke with patient's wife and she said he was feeling some better. She said the bite had actually drained over the weekend and looked a lot better than it did. She said he hasn't started the abx yet because he only had an hour to get to the airport after he left the ER. Then he thought he couldn't get it filled out of state once he got home, so they were going to call here today. I advised there shouldn't be issues getting it filled but to call back if there was and we would handle it. She verbalized understanding and would let him know that we called to check on him.

## 2013-10-12 ENCOUNTER — Other Ambulatory Visit: Payer: Self-pay

## 2013-10-12 DIAGNOSIS — M5416 Radiculopathy, lumbar region: Principal | ICD-10-CM

## 2013-10-12 DIAGNOSIS — G8929 Other chronic pain: Secondary | ICD-10-CM

## 2013-10-12 MED ORDER — CYCLOBENZAPRINE HCL 10 MG PO TABS: ORAL_TABLET | ORAL | Status: DC

## 2013-10-12 MED ORDER — OXYCODONE-ACETAMINOPHEN 5-325 MG PO TABS: 1.0000 | ORAL_TABLET | Freq: Three times a day (TID) | ORAL | Status: DC | PRN

## 2013-10-12 NOTE — Telephone Encounter (Signed)
Printed and placed in Kim's box. 

## 2013-10-12 NOTE — Telephone Encounter (Signed)
Aaron [name] left v/m requesting rx oxycodone apap. Call when ready for pick up. Also request refill cyclobenzaprine to [facility].

## 2013-10-13 NOTE — Telephone Encounter (Signed)
Patient notified and Rx placed up front for pick up. 

## 2013-10-14 ENCOUNTER — Ambulatory Visit: Payer: BC Managed Care – PPO | Admitting: Psychology

## 2013-10-15 ENCOUNTER — Ambulatory Visit (INDEPENDENT_AMBULATORY_CARE_PROVIDER_SITE_OTHER): Payer: BC Managed Care – PPO | Admitting: Family Medicine

## 2013-10-15 ENCOUNTER — Ambulatory Visit: Payer: Managed Care, Other (non HMO) | Admitting: Family Medicine

## 2013-10-15 ENCOUNTER — Encounter: Payer: Self-pay | Admitting: *Deleted

## 2013-10-15 ENCOUNTER — Encounter: Payer: Self-pay | Admitting: Family Medicine

## 2013-10-15 VITALS — BP 120/76 | HR 84 | Temp 98.2°F | Wt 190.8 lb

## 2013-10-15 DIAGNOSIS — G894 Chronic pain syndrome: Secondary | ICD-10-CM

## 2013-10-15 DIAGNOSIS — F17200 Nicotine dependence, unspecified, uncomplicated: Secondary | ICD-10-CM

## 2013-10-15 DIAGNOSIS — F4310 Post-traumatic stress disorder, unspecified: Secondary | ICD-10-CM

## 2013-10-15 MED ORDER — SERTRALINE HCL 50 MG PO TABS: 50.0000 mg | ORAL_TABLET | Freq: Every day | ORAL | Status: DC

## 2013-10-15 NOTE — Patient Instructions (Signed)
Let's start sertraline 50mg  daily. Space sertraline start date at least 1 week apart from chantix start date. Return to see me in 6 weeks.  Let me know sooner if any concerns.

## 2013-10-15 NOTE — Progress Notes (Signed)
Pre visit review using our clinic review tool, if applicable. No additional management support is needed unless otherwise documented below in the visit note. 

## 2013-10-15 NOTE — Progress Notes (Signed)
BP 120/76  Pulse 84  Temp(Src) 98.2 F (36.8 C) (Oral)  Wt 190 lb 12 oz (86.524 kg)   CC: 1 mo f/u  Subjective:    Patient ID: Aaron [name], male    DOB: [birth year], 33 y.o.   MRN: [id number]  HPI: Aaron [name] is a 33 y.o. male presenting on 10/15/2013 for Follow-up   See prior note for details.  I did get records from [place].  ?PTSD - recurring thoughts of father's collapse - massive MI with resultant head injury with 3 mo coma and 9 years of vegetative state until he passed (this happened when pt was 33 yrs old). Persistent trouble sleeping. Has not been on mood med in past. Did not tolerate ambien. No SI/HI.  Has f/u appt scheduled with Terri at our office in next few weeks.  Smoker - on chantix starting month pack - tolerating well.  1 ppd.  Recent spider bite infected and treated with cipro - while in [facility], saw Sweeny there.  Relevant past medical, surgical, family and social history reviewed and updated as indicated.  Allergies and medications reviewed and updated. Current Outpatient Prescriptions on File Prior to Visit  Medication Sig  . amLODipine (NORVASC) 10 MG tablet Take 1 tablet (10 mg total) by mouth daily.  . cyclobenzaprine (FLEXERIL) 10 MG tablet TAKE 1 TABLET (10 MG TOTAL) BY MOUTH 3 (THREE) TIMES DAILY AS NEEDED FOR MUSCLE SPASMS.  Marland Kitchen loperamide (IMODIUM) 2 MG capsule Take 4 mg by mouth as needed for diarrhea or loose stools.  Marland Kitchen oxyCODONE-acetaminophen (PERCOCET/ROXICET) 5-325 MG per tablet Take 1 tablet by mouth every 8 (eight) hours as needed for severe pain.  . varenicline (CHANTIX STARTING MONTH PAK) 0.5 MG X 11 & 1 MG X 42 tablet Take one 0.5 mg tablet by mouth once daily for 3 days, then increase to one 0.5 mg tablet twice daily for 4 days, then increase to one 1 mg tablet twice daily.  . varenicline (CHANTIX CONTINUING MONTH PAK) 1 MG tablet Take 1 tablet (1 mg total) by mouth 2 (two) times daily.   No current facility-administered medications on file  prior to visit.    Review of Systems Per HPI unless specifically indicated above    Objective:    BP 120/76  Pulse 84  Temp(Src) 98.2 F (36.8 C) (Oral)  Wt 190 lb 12 oz (86.524 kg)  Physical Exam  Nursing note and vitals reviewed. Constitutional: He appears well-developed and well-nourished. No distress.  HENT:  Mouth/Throat: Oropharynx is clear and moist. No oropharyngeal exudate.  Cardiovascular: Normal rate, regular rhythm, normal heart sounds and intact distal pulses.   No murmur heard. Pulmonary/Chest: Effort normal and breath sounds normal. No respiratory distress. He has no wheezes. He has no rales.  Psychiatric: His speech is normal and behavior is normal. Judgment and thought content normal. His mood appears anxious. Cognition and memory are normal.  restless       Assessment & Plan:   Problem List Items Addressed This Visit   Smoker     Day 2 of chantix. Continue to encourage cessation.    PTSD (post-traumatic stress disorder) - Primary     Continue seeing Terri. Will start sertraline 50mg  daily, space out from commencement of chantix by at least 1 week. F/u in 6 wks. Pt agrees with plan.    Chronic pain syndrome     Records from [place] reviewed.        Follow up plan: Return in about  6 weeks (around 11/26/2013), or if symptoms worsen or fail to improve, for follow up visit.

## 2013-10-15 NOTE — Assessment & Plan Note (Signed)
Continue seeing Terri. Will start sertraline 50mg  daily, space out from commencement of chantix by at least 1 week. F/u in 6 wks. Pt agrees with plan.

## 2013-10-15 NOTE — Assessment & Plan Note (Signed)
Day 2 of chantix. Continue to encourage cessation.

## 2013-10-15 NOTE — Assessment & Plan Note (Signed)
Records from [place] reviewed.

## 2013-10-18 ENCOUNTER — Ambulatory Visit: Payer: Managed Care, Other (non HMO) | Admitting: Family Medicine

## 2013-11-01 ENCOUNTER — Other Ambulatory Visit: Payer: Self-pay | Admitting: Family Medicine

## 2013-11-01 DIAGNOSIS — M5416 Radiculopathy, lumbar region: Principal | ICD-10-CM

## 2013-11-01 DIAGNOSIS — G8929 Other chronic pain: Secondary | ICD-10-CM

## 2013-11-01 MED ORDER — OXYCODONE-ACETAMINOPHEN 5-325 MG PO TABS: 1.0000 | ORAL_TABLET | Freq: Three times a day (TID) | ORAL | Status: DC | PRN

## 2013-11-01 NOTE — Telephone Encounter (Signed)
Aaron [name] left v/m requesting rx oxycodone apap. Call when ready for pick up.

## 2013-11-01 NOTE — Telephone Encounter (Signed)
Printed and placed in Kim's box. 

## 2013-11-01 NOTE — Telephone Encounter (Signed)
Ok to refill 

## 2013-11-03 NOTE — Telephone Encounter (Signed)
Placed up front for pick up. Patient aware.  

## 2013-11-21 ENCOUNTER — Emergency Department: Payer: Self-pay | Admitting: Emergency Medicine

## 2013-11-21 LAB — COMPREHENSIVE METABOLIC PANEL
ALBUMIN: 4.5 g/dL (ref 3.4–5.0)
AST: 39 U/L — AB (ref 15–37)
Alkaline Phosphatase: 84 U/L
Anion Gap: 7 (ref 7–16)
BUN: 6 mg/dL — ABNORMAL LOW (ref 7–18)
Bilirubin,Total: 0.5 mg/dL (ref 0.2–1.0)
CO2: 29 mmol/L (ref 21–32)
Calcium, Total: 9.3 mg/dL (ref 8.5–10.1)
Chloride: 102 mmol/L (ref 98–107)
Creatinine: 1.06 mg/dL (ref 0.60–1.30)
EGFR (African American): >60
Glucose: 107 mg/dL — ABNORMAL HIGH (ref 65–99)
OSMOLALITY: 274 (ref 275–301)
Potassium: 3.4 mmol/L — ABNORMAL LOW (ref 3.5–5.1)
SGPT (ALT): 51 U/L (ref 12–78)
SODIUM: 138 mmol/L (ref 136–145)
TOTAL PROTEIN: 8.1 g/dL (ref 6.4–8.2)

## 2013-11-21 LAB — ETHANOL
Ethanol %: 0.168 % — ABNORMAL HIGH (ref 0.000–0.080)
Ethanol: 168 mg/dL

## 2013-11-21 LAB — URINALYSIS, COMPLETE
Bacteria: NONE SEEN
Bilirubin,UR: NEGATIVE
Blood: NEGATIVE
Glucose,UR: NEGATIVE mg/dL (ref 0–75)
KETONE: NEGATIVE
Leukocyte Esterase: NEGATIVE
NITRITE: NEGATIVE
PH: 6 (ref 4.5–8.0)
PROTEIN: NEGATIVE
RBC, UR: NONE SEEN /HPF (ref 0–5)
Specific Gravity: 1.004 (ref 1.003–1.030)
Squamous Epithelial: NONE SEEN
WBC UR: NONE SEEN /HPF (ref 0–5)

## 2013-11-21 LAB — DRUG SCREEN, URINE
Amphetamines, Ur Screen: NEGATIVE (ref ?–1000)
BARBITURATES, UR SCREEN: NEGATIVE (ref ?–200)
BENZODIAZEPINE, UR SCRN: POSITIVE (ref ?–200)
CANNABINOID 50 NG, UR ~~LOC~~: NEGATIVE (ref ?–50)
COCAINE METABOLITE, UR ~~LOC~~: NEGATIVE (ref ?–300)
MDMA (ECSTASY) UR SCREEN: NEGATIVE (ref ?–500)
Methadone, Ur Screen: NEGATIVE (ref ?–300)
Opiate, Ur Screen: NEGATIVE (ref ?–300)
Phencyclidine (PCP) Ur S: NEGATIVE (ref ?–25)
Tricyclic, Ur Screen: NEGATIVE (ref ?–1000)

## 2013-11-21 LAB — SALICYLATE LEVEL: Salicylates, Serum: 2.5 mg/dL

## 2013-11-21 LAB — ACETAMINOPHEN LEVEL

## 2013-11-21 LAB — CBC
HCT: 47.1 % (ref 40.0–52.0)
HGB: 16.2 g/dL (ref 13.0–18.0)
MCH: 30.8 pg (ref 26.0–34.0)
MCHC: 34.3 g/dL (ref 32.0–36.0)
MCV: 90 fL (ref 80–100)
Platelet: 186 10*3/uL (ref 150–440)
RBC: 5.24 10*6/uL (ref 4.40–5.90)
RDW: 12.6 % (ref 11.5–14.5)
WBC: 7.3 10*3/uL (ref 3.8–10.6)

## 2013-11-21 LAB — LIPASE, BLOOD: Lipase: 155 U/L (ref 73–393)

## 2013-11-21 LAB — MAGNESIUM: Magnesium: 2 mg/dL

## 2013-11-22 ENCOUNTER — Other Ambulatory Visit: Payer: Self-pay

## 2013-11-22 DIAGNOSIS — G8929 Other chronic pain: Secondary | ICD-10-CM

## 2013-11-22 DIAGNOSIS — M5416 Radiculopathy, lumbar region: Principal | ICD-10-CM

## 2013-11-22 NOTE — Telephone Encounter (Signed)
Aaron [name] left v/m requesting rx oxycodone apap. Call when ready for pick up.

## 2013-11-23 MED ORDER — OXYCODONE-ACETAMINOPHEN 5-325 MG PO TABS: 1.0000 | ORAL_TABLET | Freq: Three times a day (TID) | ORAL | Status: DC | PRN

## 2013-11-23 NOTE — Telephone Encounter (Signed)
Message left notifying patient. Rx placed up front for pick up. 

## 2013-11-23 NOTE — Telephone Encounter (Signed)
printed and placed in [facility] [facility]' box We are a week early on refill and have been consistently so - will discuss at upcoming appt next week

## 2013-11-26 ENCOUNTER — Ambulatory Visit: Payer: BC Managed Care – PPO | Admitting: Family Medicine

## 2013-11-29 ENCOUNTER — Ambulatory Visit (INDEPENDENT_AMBULATORY_CARE_PROVIDER_SITE_OTHER): Payer: BC Managed Care – PPO | Admitting: Family Medicine

## 2013-11-29 ENCOUNTER — Encounter: Payer: Self-pay | Admitting: Family Medicine

## 2013-11-29 VITALS — BP 136/84 | HR 84 | Temp 97.9°F | Wt 187.5 lb

## 2013-11-29 DIAGNOSIS — F4310 Post-traumatic stress disorder, unspecified: Secondary | ICD-10-CM

## 2013-11-29 DIAGNOSIS — F17200 Nicotine dependence, unspecified, uncomplicated: Secondary | ICD-10-CM

## 2013-11-29 NOTE — Assessment & Plan Note (Signed)
Continues tapering off cigarettes. Continue chantix, discussed option to refill chantix for 1 more month (3 mo total). Discussed using NRT to bridge off chantix.

## 2013-11-29 NOTE — Patient Instructions (Signed)
Let's stay off sertraline. Continue chantix. Goal stopping cigarettes before you run out of chantix. Consider nicorette gum as a bridge to stay off cigarettes when you stop chantix. Once off chantix, let me know if you'd like to try new mood medicine to help with recurring bad dreams - this would be a medicines that could help with sleeping as well. Reschedule appointment with Terri. Good to see you today, call us with questions. Follow up in 3-4 months, sooner if needed.

## 2013-11-29 NOTE — Progress Notes (Signed)
Pre visit review using our clinic review tool, if applicable. No additional management support is needed unless otherwise documented below in the visit note. 

## 2013-11-29 NOTE — Progress Notes (Signed)
BP 136/84  Pulse 84  Temp(Src) 97.9 F (36.6 C) (Oral)  Wt 187 lb 8 oz (85.049 kg)   CC: 6 wk f/u  Subjective:    Patient ID: Aaron [name], male    DOB: 21-May-1981, 33 y.o.   MRN: [id number]  HPI: Aaron [name] is a 33 y.o. male presenting on 11/29/2013 for Follow-up   See prior note for details.  PTSD - recurring thoughts of father's collapse - massive MI with resultant head injury with 3 mo coma and 9 years of vegetative state until he passed (this happened when pt was 33 yrs old). Persistent trouble sleeping. Has not been on mood med in past. Did not tolerate ambien. No SI/HI. Missed last appt with Terri because of work, needs to reschedule.  Tried sertraline 50mg  daily for 1 month - gave him bad thoughts, he bashed his head on kitchen counter, made him lethargic and like a zombie.  Feels better off sertraline but persistent PTSD sxs.  Smoker - on chantix starting month pack - tolerating well without mood changes. 3 cig/day.   Relevant past medical, surgical, family and social history reviewed and updated as indicated.  Allergies and medications reviewed and updated. Current Outpatient Prescriptions on File Prior to Visit  Medication Sig  . amLODipine (NORVASC) 10 MG tablet Take 1 tablet (10 mg total) by mouth daily.  . cyclobenzaprine (FLEXERIL) 10 MG tablet TAKE 1 TABLET (10 MG TOTAL) BY MOUTH 3 (THREE) TIMES DAILY AS NEEDED FOR MUSCLE SPASMS.  Marland Kitchen loperamide (IMODIUM) 2 MG capsule Take 4 mg by mouth as needed for diarrhea or loose stools.  Marland Kitchen oxyCODONE-acetaminophen (PERCOCET/ROXICET) 5-325 MG per tablet Take 1 tablet by mouth every 8 (eight) hours as needed for severe pain.  . varenicline (CHANTIX CONTINUING MONTH PAK) 1 MG tablet Take 1 tablet (1 mg total) by mouth 2 (two) times daily.   No current facility-administered medications on file prior to visit.    Review of Systems Per HPI unless specifically indicated above    Objective:    BP 136/84  Pulse 84  Temp(Src)  97.9 F (36.6 C) (Oral)  Wt 187 lb 8 oz (85.049 kg)  Physical Exam  Nursing note and vitals reviewed. Constitutional: He appears well-developed and well-nourished. No distress.  HENT:  Head: Normocephalic and atraumatic.  Mouth/Throat: Oropharynx is clear and moist. No oropharyngeal exudate.  Cardiovascular: Normal rate, regular rhythm, normal heart sounds and intact distal pulses.   No murmur heard. Pulmonary/Chest: Effort normal and breath sounds normal. No respiratory distress. He has no wheezes. He has no rales.  Psychiatric:  nervous   Results for orders placed in visit on 09/20/13  LIPASE      Result Value Ref Range   Lipase 17  0 - 53 units/L  CBC      Result Value Ref Range   WBC 5.1     HGB 15.5     platelet count 172    COMPREHENSIVE METABOLIC PANEL      Result Value Ref Range   Alkaline Phosphatase 61     Total Bilirubin 0.6     Creat 1.0     Glucose 96     Total Protein 7.0     AST 23     ALT 28  10 - 40 U/L  C-REACTIVE PROTEIN      Result Value Ref Range   CRP 1.1    SEDIMENTATION RATE      Result Value Ref Range  Sed Rate 5        Assessment & Plan:   Problem List Items Addressed This Visit   Smoker - Primary     Continues tapering off cigarettes. Continue chantix, discussed option to refill chantix for 1 more month (3 mo total). Discussed using NRT to bridge off chantix.    PTSD (post-traumatic stress disorder)     Intolerant of sertraline - worsened lethargy and self injurious behavior, along with bad thoughts. ?combination of chantix and sertraline. Stop psychotropic for now, until fully off chantix, then consider trial of remeron trazodone or amitriptyline  Pt agrees with plan. Open ended f/u, or in 3-4 mo.        Follow up plan: Return in about 4 months (around 03/31/2014), or as needed, for follow up visit.

## 2013-11-29 NOTE — Assessment & Plan Note (Signed)
Intolerant of sertraline - worsened lethargy and self injurious behavior, along with bad thoughts. ?combination of chantix and sertraline. Stop psychotropic for now, until fully off chantix, then consider trial of remeron trazodone or amitriptyline  Pt agrees with plan. Open ended f/u, or in 3-4 mo.

## 2013-12-13 ENCOUNTER — Other Ambulatory Visit: Payer: Self-pay

## 2013-12-13 DIAGNOSIS — M5416 Radiculopathy, lumbar region: Principal | ICD-10-CM

## 2013-12-13 DIAGNOSIS — G8929 Other chronic pain: Secondary | ICD-10-CM

## 2013-12-13 MED ORDER — OXYCODONE-ACETAMINOPHEN 5-325 MG PO TABS: 1.0000 | ORAL_TABLET | Freq: Three times a day (TID) | ORAL | Status: DC | PRN

## 2013-12-13 NOTE — Telephone Encounter (Signed)
Patient's wife notified and Rx placed up front for pick up. 

## 2013-12-13 NOTE — Telephone Encounter (Signed)
Printed and placed in [facility]' sbox. Too early however - wrote fill date 12/20/2013 Want #60 to last 1 entire month.

## 2013-12-13 NOTE — Telephone Encounter (Signed)
Mrs Enyeart left v/m requesting rx oxycodone apap. Call when ready for pick up.

## 2014-01-10 ENCOUNTER — Other Ambulatory Visit: Payer: Self-pay

## 2014-01-10 DIAGNOSIS — G8929 Other chronic pain: Secondary | ICD-10-CM

## 2014-01-10 DIAGNOSIS — M5416 Radiculopathy, lumbar region: Principal | ICD-10-CM

## 2014-01-10 MED ORDER — OXYCODONE-ACETAMINOPHEN 5-325 MG PO TABS: 1.0000 | ORAL_TABLET | Freq: Three times a day (TID) | ORAL | Status: DC | PRN

## 2014-01-10 NOTE — Telephone Encounter (Signed)
Aaron [name] left v/m requesting rx oxycodone apap. Call when ready for pick up. Additional note on rx from last month had to fill on 12/20/13. Do you want this rx to say fill on 01/20/14.Please advise.

## 2014-01-10 NOTE — Telephone Encounter (Signed)
Printed and placed in Kim's box. 

## 2014-01-11 ENCOUNTER — Other Ambulatory Visit: Payer: Self-pay | Admitting: Family Medicine

## 2014-01-11 NOTE — Telephone Encounter (Signed)
Ok to refill 

## 2014-01-11 NOTE — Telephone Encounter (Signed)
Patient's wife notified and Rx placed up front for pick up. 

## 2014-02-01 HISTORY — PX: OTHER SURGICAL HISTORY: SHX169

## 2014-02-01 HISTORY — PX: MRI: SHX5353

## 2014-02-02 ENCOUNTER — Other Ambulatory Visit: Payer: Self-pay | Admitting: Family Medicine

## 2014-02-02 DIAGNOSIS — G8929 Other chronic pain: Secondary | ICD-10-CM

## 2014-02-02 DIAGNOSIS — M5416 Radiculopathy, lumbar region: Principal | ICD-10-CM

## 2014-02-02 NOTE — Telephone Encounter (Signed)
Pt's wife called to request refill on Oxycodone.  She said that RX given on 01/10/14 was for 20 day supply.

## 2014-02-03 ENCOUNTER — Encounter: Payer: Self-pay | Admitting: Family Medicine

## 2014-02-03 MED ORDER — OXYCODONE-ACETAMINOPHEN 5-325 MG PO TABS: 1.0000 | ORAL_TABLET | Freq: Three times a day (TID) | ORAL | Status: DC | PRN

## 2014-02-03 NOTE — Telephone Encounter (Signed)
Printed and placed in Aaron [name]'s box. plz remind pt/wife that narcotic is prn for breakthrough pain. If needing scheduled needs to see me to discuss increased need. Will need UDS next visit.

## 2014-02-03 NOTE — Telephone Encounter (Signed)
Patient's wife notified and Rx placed up front for pick up. Appt scheduled to discuss meds.

## 2014-02-10 ENCOUNTER — Telehealth: Payer: Self-pay | Admitting: Family Medicine

## 2014-02-10 ENCOUNTER — Emergency Department: Payer: Self-pay | Admitting: Emergency Medicine

## 2014-02-10 LAB — TROPONIN I: Troponin-I: <0.02 ng/mL

## 2014-02-10 LAB — CBC
HCT: 47.1 % (ref 40.0–52.0)
HGB: 15.6 g/dL (ref 13.0–18.0)
MCH: 30.2 pg (ref 26.0–34.0)
MCHC: 33.2 g/dL (ref 32.0–36.0)
MCV: 91 fL (ref 80–100)
Platelet: 182 10*3/uL (ref 150–440)
RBC: 5.17 10*6/uL (ref 4.40–5.90)
RDW: 12.9 % (ref 11.5–14.5)
WBC: 6.1 10*3/uL (ref 3.8–10.6)

## 2014-02-10 LAB — BASIC METABOLIC PANEL
ANION GAP: 7 (ref 7–16)
BUN: 9 mg/dL (ref 7–18)
Calcium, Total: 8.6 mg/dL (ref 8.5–10.1)
Chloride: 108 mmol/L — ABNORMAL HIGH (ref 98–107)
Co2: 26 mmol/L (ref 21–32)
Creatinine: 0.96 mg/dL (ref 0.60–1.30)
EGFR (African American): >60
EGFR (Non-African Amer.): >60
Glucose: 105 mg/dL — ABNORMAL HIGH (ref 65–99)
OSMOLALITY: 280 (ref 275–301)
Potassium: 3.7 mmol/L (ref 3.5–5.1)
SODIUM: 141 mmol/L (ref 136–145)

## 2014-02-10 LAB — D-DIMER(ARMC): D-DIMER: 126 ng/mL

## 2014-02-10 NOTE — Telephone Encounter (Signed)
Patient Information:  Caller Name: Joelene Millin  Phone: [phone]  Patient: Aaron [name], Aaron [name]  Gender: Male  DOB: 1980-09-06  Age: 33 Years  PCP: Ria Bush [facility])  Office Follow Up:  Does the office need to follow up with this patient?: No  Instructions For The Office: N/A  RN Note:  Wife will take pt to [facility] now, for further assessment.  Symptoms  Reason For Call & Symptoms: Wife calling regarding pt's elevated BP 158/103 at 1330. Takes 0 mg Amlodipine daily and took at 10 am this am, 02/10/14. States pt is SOB, clammy and  has neck pain.  Reviewed Health History In EMR: Yes  Reviewed Medications In EMR: Yes  Reviewed Allergies In EMR: Yes  Reviewed Surgeries / Procedures: Yes  Date of Onset of Symptoms: 02/10/2014  Guideline(s) Used:  High Blood Pressure  Disposition Per Guideline:   Go to ED Now (or to Office with PCP Approval)  Reason For Disposition Reached:   BP > 160 / 100 and any cardiac or neurologic symptoms (e.g., chest pain, difficulty breathing, unsteady gait, blurred vision)  Advice Given:  N/A  Patient Will Follow Care Advice:  YES

## 2014-02-10 NOTE — Telephone Encounter (Signed)
Noted. plz call tomorrow for update. 

## 2014-02-11 ENCOUNTER — Inpatient Hospital Stay (HOSPITAL_COMMUNITY)
Admission: AD | Admit: 2014-02-11 | Discharge: 2014-02-12 | DRG: 313 | Disposition: A | Discharge status: Home / Self Care | Payer: BC Managed Care – PPO | Source: Ambulatory Visit | Attending: Cardiovascular Disease | Admitting: Cardiovascular Disease

## 2014-02-11 ENCOUNTER — Telehealth: Payer: Self-pay | Admitting: Family Medicine

## 2014-02-11 ENCOUNTER — Other Ambulatory Visit: Payer: Self-pay | Admitting: Cardiovascular Disease

## 2014-02-11 ENCOUNTER — Ambulatory Visit
Admission: RE | Admit: 2014-02-11 | Discharge: 2014-02-11 | Disposition: A | Discharge status: Home / Self Care | Payer: BC Managed Care – PPO | Source: Ambulatory Visit | Attending: Cardiovascular Disease | Admitting: Cardiovascular Disease

## 2014-02-11 DIAGNOSIS — R209 Unspecified disturbances of skin sensation: Secondary | ICD-10-CM | POA: Diagnosis present

## 2014-02-11 DIAGNOSIS — R42 Dizziness and giddiness: Secondary | ICD-10-CM | POA: Diagnosis present

## 2014-02-11 DIAGNOSIS — K7689 Other specified diseases of liver: Secondary | ICD-10-CM | POA: Diagnosis present

## 2014-02-11 DIAGNOSIS — K589 Irritable bowel syndrome without diarrhea: Secondary | ICD-10-CM | POA: Diagnosis present

## 2014-02-11 DIAGNOSIS — G894 Chronic pain syndrome: Secondary | ICD-10-CM | POA: Diagnosis present

## 2014-02-11 DIAGNOSIS — R51 Headache: Secondary | ICD-10-CM | POA: Diagnosis present

## 2014-02-11 DIAGNOSIS — R079 Chest pain, unspecified: Secondary | ICD-10-CM | POA: Diagnosis present

## 2014-02-11 DIAGNOSIS — M79602 Pain in left arm: Secondary | ICD-10-CM

## 2014-02-11 DIAGNOSIS — F17200 Nicotine dependence, unspecified, uncomplicated: Secondary | ICD-10-CM | POA: Diagnosis present

## 2014-02-11 DIAGNOSIS — Z79899 Other long term (current) drug therapy: Secondary | ICD-10-CM

## 2014-02-11 DIAGNOSIS — Z8249 Family history of ischemic heart disease and other diseases of the circulatory system: Secondary | ICD-10-CM | POA: Diagnosis not present

## 2014-02-11 DIAGNOSIS — E785 Hyperlipidemia, unspecified: Secondary | ICD-10-CM | POA: Diagnosis present

## 2014-02-11 DIAGNOSIS — M542 Cervicalgia: Secondary | ICD-10-CM

## 2014-02-11 DIAGNOSIS — I10 Essential (primary) hypertension: Secondary | ICD-10-CM | POA: Diagnosis present

## 2014-02-11 DIAGNOSIS — M79609 Pain in unspecified limb: Secondary | ICD-10-CM | POA: Diagnosis present

## 2014-02-11 LAB — CBC WITH DIFFERENTIAL/PLATELET
BASOS PCT: 1 % (ref 0–1)
Basophils Absolute: 0 10*3/uL (ref 0.0–0.1)
Eosinophils Absolute: 0.2 10*3/uL (ref 0.0–0.7)
Eosinophils Relative: 2 % (ref 0–5)
HCT: 41.1 % (ref 39.0–52.0)
HEMOGLOBIN: 14.1 g/dL (ref 13.0–17.0)
Lymphocytes Relative: 39 % (ref 12–46)
Lymphs Abs: 2.4 10*3/uL (ref 0.7–4.0)
MCH: 30.5 pg (ref 26.0–34.0)
MCHC: 34.3 g/dL (ref 30.0–36.0)
MCV: 88.8 fL (ref 78.0–100.0)
MONOS PCT: 8 % (ref 3–12)
Monocytes Absolute: 0.5 10*3/uL (ref 0.1–1.0)
NEUTROS ABS: 3.1 10*3/uL (ref 1.7–7.7)
Neutrophils Relative %: 50 % (ref 43–77)
Platelets: 169 10*3/uL (ref 150–400)
RBC: 4.63 MIL/uL (ref 4.22–5.81)
RDW: 12.6 % (ref 11.5–15.5)
WBC: 6.2 10*3/uL (ref 4.0–10.5)

## 2014-02-11 LAB — COMPREHENSIVE METABOLIC PANEL
ALBUMIN: 3.8 g/dL (ref 3.5–5.2)
ALT: 28 U/L (ref 0–53)
AST: 21 U/L (ref 0–37)
Alkaline Phosphatase: 61 U/L (ref 39–117)
Anion gap: 12 (ref 5–15)
BILIRUBIN TOTAL: 0.3 mg/dL (ref 0.3–1.2)
BUN: 11 mg/dL (ref 6–23)
CHLORIDE: 102 meq/L (ref 96–112)
CO2: 26 mEq/L (ref 19–32)
CREATININE: 1.03 mg/dL (ref 0.50–1.35)
Calcium: 9.1 mg/dL (ref 8.4–10.5)
GFR calc Af Amer: >90 mL/min (ref >90)
GFR calc non Af Amer: >90 mL/min (ref >90)
Glucose, Bld: 80 mg/dL (ref 70–99)
POTASSIUM: 3.8 meq/L (ref 3.7–5.3)
Sodium: 140 mEq/L (ref 137–147)
TOTAL PROTEIN: 6.6 g/dL (ref 6.0–8.3)

## 2014-02-11 LAB — TROPONIN I: Troponin I: <0.3 ng/mL (ref <0.30)

## 2014-02-11 LAB — PROTIME-INR
INR: 1.09 (ref 0.00–1.49)
PROTHROMBIN TIME: 14.1 s (ref 11.6–15.2)

## 2014-02-11 MED ORDER — SODIUM CHLORIDE 0.9 % IV SOLN: 250.0000 mL | INTRAVENOUS | Status: DC | PRN

## 2014-02-11 MED ORDER — VARENICLINE TARTRATE 1 MG PO TABS: 1.0000 mg | ORAL_TABLET | Freq: Two times a day (BID) | ORAL | Status: DC

## 2014-02-11 MED ORDER — AMLODIPINE BESYLATE 10 MG PO TABS
10.0000 mg | ORAL_TABLET | Freq: Every day | ORAL | Status: DC
  Administered 2014-02-12: 10 mg via ORAL
  Filled 2014-02-11: qty 1

## 2014-02-11 MED ORDER — ASPIRIN EC 81 MG PO TBEC
81.0000 mg | DELAYED_RELEASE_TABLET | Freq: Every day | ORAL | Status: DC
  Administered 2014-02-12: 81 mg via ORAL
  Filled 2014-02-11: qty 1

## 2014-02-11 MED ORDER — SODIUM CHLORIDE 0.9 % IJ SOLN: 3.0000 mL | INTRAMUSCULAR | Status: DC | PRN

## 2014-02-11 MED ORDER — HEPARIN (PORCINE) IN NACL 100-0.45 UNIT/ML-% IJ SOLN
1600.0000 [IU]/h | INTRAMUSCULAR | Status: DC
  Administered 2014-02-11: 1000 [IU]/h via INTRAVENOUS
  Administered 2014-02-12: 1300 [IU]/h via INTRAVENOUS
  Administered 2014-02-12: 1600 [IU]/h via INTRAVENOUS
  Filled 2014-02-11 (×3): qty 250

## 2014-02-11 MED ORDER — ASPIRIN 300 MG RE SUPP: 300.0000 mg | RECTAL | Status: AC

## 2014-02-11 MED ORDER — ASPIRIN 81 MG PO CHEW
324.0000 mg | CHEWABLE_TABLET | ORAL | Status: AC
  Administered 2014-02-11: 324 mg via ORAL
  Filled 2014-02-11: qty 4

## 2014-02-11 MED ORDER — METOPROLOL SUCCINATE 12.5 MG HALF TABLET
12.5000 mg | ORAL_TABLET | Freq: Every day | ORAL | Status: DC
  Administered 2014-02-11 – 2014-02-12 (×2): 12.5 mg via ORAL
  Filled 2014-02-11 (×2): qty 1

## 2014-02-11 MED ORDER — LISINOPRIL 5 MG PO TABS
5.0000 mg | ORAL_TABLET | Freq: Every day | ORAL | Status: DC
  Administered 2014-02-12: 5 mg via ORAL
  Filled 2014-02-11: qty 1

## 2014-02-11 MED ORDER — LOPERAMIDE HCL 2 MG PO CAPS: 4.0000 mg | ORAL_CAPSULE | ORAL | Status: DC | PRN

## 2014-02-11 MED ORDER — SODIUM CHLORIDE 0.9 % IJ SOLN: 3.0000 mL | Freq: Two times a day (BID) | INTRAMUSCULAR | Status: DC

## 2014-02-11 MED ORDER — ACETAMINOPHEN 325 MG PO TABS
650.0000 mg | ORAL_TABLET | ORAL | Status: DC | PRN
  Administered 2014-02-11 – 2014-02-12 (×3): 650 mg via ORAL
  Filled 2014-02-11 (×3): qty 2

## 2014-02-11 MED ORDER — HEPARIN BOLUS VIA INFUSION
4000.0000 [IU] | Freq: Once | INTRAVENOUS | Status: AC
  Administered 2014-02-11: 4000 [IU] via INTRAVENOUS
  Filled 2014-02-11: qty 4000

## 2014-02-11 MED ORDER — LOPERAMIDE HCL 2 MG PO CAPS: 2.0000 mg | ORAL_CAPSULE | Freq: Two times a day (BID) | ORAL | Status: DC | PRN

## 2014-02-11 MED ORDER — ONDANSETRON HCL 4 MG/2ML IJ SOLN: 4.0000 mg | Freq: Four times a day (QID) | INTRAMUSCULAR | Status: DC | PRN

## 2014-02-11 MED ORDER — ATORVASTATIN CALCIUM 40 MG PO TABS
40.0000 mg | ORAL_TABLET | Freq: Every day | ORAL | Status: DC
  Administered 2014-02-12: 40 mg via ORAL
  Filled 2014-02-11: qty 1

## 2014-02-11 MED ORDER — ALPRAZOLAM 0.25 MG PO TABS
0.2500 mg | ORAL_TABLET | Freq: Two times a day (BID) | ORAL | Status: DC | PRN
  Administered 2014-02-11: 0.25 mg via ORAL
  Filled 2014-02-11: qty 1

## 2014-02-11 MED ORDER — NITROGLYCERIN 0.4 MG SL SUBL: 0.4000 mg | SUBLINGUAL_TABLET | SUBLINGUAL | Status: DC | PRN

## 2014-02-11 NOTE — Telephone Encounter (Signed)
Message left for patient's wife to return my call.  

## 2014-02-11 NOTE — H&P (Signed)
Referring Physician:J. Danise Mina, MD  Aaron [name] is an 33 y.o. male.                       Chief Complaint: Chest pain  HPI: 33 year old male with uncontrolled hypertension, headache, dizziness has left sided chest pain, left arm pain and numbness. Some sweating and shortness of breath.  Past Medical History  Diagnosis Date  . Hx of colonic polyps   . History of seizure disorder childhood    last seizure at 33 y/o, currently off meds  . HTN (hypertension)   . Chronic diarrhea     thought due to IBS, s/p colonoscopies in past  . 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 Retained metal fragment 03/2012    R lat abd wall by CT scan  . Fatty liver 03/2012    by CT, mild HSM  . Diverticulosis 03/2012    left sided by CT  . Chronic pain syndrome 2010    back injury      Past Surgical History  Procedure Laterality Date  . Appendectomy  2013    2014 repeat surgery for stump appendix (at [place])  . Colonoscopy  06/2012    ext hemm, 1 rectal polyp ([facility]) [place] yrs ([facility])  . Colonoscopy  07/2013    hypertrophied anal papillae, hemm, normal biopsies ([facility])  . Esi  2012    effective x1 mo  . Discogram      L2/3, L3/4, L5/S1 HNP with pinched nerves    Family History  Problem Relation Age of Onset  . CAD Father 68    massive MI  . Cancer Other     colon - 4 maternal and paternal aunts and uncles  . Cancer Maternal Grandfather     colon  . Cancer Maternal Aunt     breast  . Hypertension Father   . Stroke Neg Hx   . Diabetes Neg Hx   . GI Disease Other     crohn's and UC   Social History:  reports that he has been smoking Cigarettes.  He started smoking about 17 years ago. He has been smoking about 0.00 packs per day. He has never used smokeless tobacco. He reports that he drinks alcohol. He reports that he does not use illicit drugs.  Allergies:   Allergies  Allergen Reactions  . Sertraline Other (See Comments)    Bad thoughts, lethargic, self harm    Medications Prior to Admission  Medication Sig Dispense Refill  . amLODipine (NORVASC) 10 MG tablet Take 1 tablet (10 mg total) by mouth daily.  30 tablet  6  . aspirin 81 MG tablet Take 81 mg by mouth daily.      . cyclobenzaprine (FLEXERIL) 10 MG tablet TAKE 1 TABLET (10 MG TOTAL) BY MOUTH 3 (THREE) TIMES DAILY AS NEEDED FOR MUSCLE SPASMS.  60 tablet  0  . lisinopril (PRINIVIL,ZESTRIL) 5 MG tablet Take 5 mg by mouth daily.      Marland Kitchen loperamide (IMODIUM) 2 MG capsule Take 4 mg by mouth as needed for diarrhea or loose stools.      Marland Kitchen oxyCODONE-acetaminophen (PERCOCET/ROXICET) 5-325 MG per tablet Take 1 tablet by mouth every 8 (eight) hours as needed for severe pain.  60 tablet  0  . Rosuvastatin Calcium (CRESTOR PO) Take 0.5 tablets  by mouth daily.      . varenicline (CHANTIX CONTINUING MONTH PAK) 1 MG tablet Take 1 tablet (1 mg total) by mouth 2 (two) times daily.  60 tablet  1    No results found for this or any previous visit (from the past 48 hour(s)). Dg Cervical Spine Complete  02/11/2014   CLINICAL DATA:  Neck and left arm pain for 2 days.  No trauma.  EXAM: CERVICAL SPINE  4+ VIEWS  COMPARISON:  None.  FINDINGS: There is no evidence of cervical spine fracture or prevertebral soft tissue swelling. Alignment is normal. No other significant bone abnormalities are identified.  IMPRESSION: Negative cervical spine radiographs.   Electronically Signed   By: Lajean Manes M.D.   On: 02/11/2014 15:08    Review Of Systems No weight gain or loss, Wears distant glasses, No dentures, No asthma, + chest pain, + headache and dizziness, No leg edema, no hepatitis, No GI or GU bleed, No stroke, Seizures or psychiatric admission.  Blood pressure 153/99, pulse 72, temperature 98.7 F (37.1 C), temperature source Oral, resp. rate 18, height 5\' 11"  (1.803 m), weight 80.831 kg (178 lb 3.2 oz), SpO2  99.00%. General: Well built and nourished. HEENT: Normocephalic, atraumatic, Blue eyes, Conj-pink, Sclera-white.Tongue-pink and midline. Neck: No JVD, No bruit Lungs: Clear, bil. Heart: Normal S1 and S2. No murmur, gallop or rub. Abdomen: Soft and non-tender. Ext:-  No E/C/C. CNS: Cranial nerves grossly intact. Moves all four extremities. Psych: Stable mood and affect.  Assessment/Plan Chest pain r/o CAD Hypertension Hyperlipidemia Tobacco use disorder  Place in observation. R/O MI. Nuclear stress test in AM.  Birdie Riddle, MD  02/11/2014, 8:41 PM

## 2014-02-11 NOTE — Telephone Encounter (Signed)
BP was 188/? at the ER last night and was still 166/101 when he left. No clot, but he is still SOB today. He has a cardiology appt today for eval and will have records faxed to you. ER really didn't give definitive diagnosis.

## 2014-02-11 NOTE — Telephone Encounter (Signed)
Pts wife stopped by to notify us that pt is now seeing a cardiologist since he had an abnormal EKG. She dropped off paper that listed the medications that he was prescribed by [facility]. Left list on desk

## 2014-02-11 NOTE — Progress Notes (Addendum)
ANTICOAGULATION CONSULT NOTE - Initial Consult  Pharmacy Consult:  Heparin Indication: chest pain/ACS  Allergies  Allergen Reactions  . Sertraline Other (See Comments)    Bad thoughts, lethargic, self harm    Patient Measurements: Height: 5\' 11"  (180.3 cm) Weight: 178 lb 3.2 oz (80.831 kg) IBW/kg (Calculated) : 75.3 Heparin Dosing Weight: 81 kg  Vital Signs: Temp: 98.7 F (37.1 C) (09/11 2003) Temp src: Oral (09/11 2003) BP: 153/99 mmHg (09/11 2003) Pulse Rate: 72 (09/11 2003)  Labs: No results found for this basename: HGB, HCT, PLT, APTT, LABPROT, INR, HEPARINUNFRC, CREATININE, CKTOTAL, CKMB, TROPONINI,  in the last 72 hours  Estimated Creatinine Clearance: 124.3 ml/min (by C-G formula based on Cr of 0.9).   Medical History: Past Medical History  Diagnosis Date  . Hx of colonic polyps   . History of seizure disorder childhood    last seizure at 33 y/o, currently off meds  . HTN (hypertension)   . Chronic diarrhea     thought due to IBS, s/p colonoscopies in past  . 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 Retained metal fragment 03/2012    R lat abd wall by CT scan  . Fatty liver 03/2012    by CT, mild HSM  . Diverticulosis 03/2012    left sided by CT  . Chronic pain syndrome 2010    back injury       Assessment: 33 YOM to start IV heparin for rule out ACS.  Awaiting baseline labs.  Not on anticoagulation PTA based on med history.   Goal of Therapy:  Heparin level 0.3-0.7 units/ml Monitor platelets by anticoagulation protocol: Yes    Plan:  - After baseline labs are drawn, start heparin 4000 units IV bolus x 1, then - Heparin gtt at 1000 units/hr - Check 6 hr HL - Daily HL / CBC    Liara Holm D. Mina Marble, PharmD, BCPS Pager:  [phone] 02/11/2014, 8:40 PM    ===============================   Addendum: - baseline CMP / CBC  WNL    Shernita Rabinovich D. Mina Marble, PharmD, BCPS Pager:  [phone] 02/11/2014, 10:05 PM

## 2014-02-11 NOTE — Telephone Encounter (Signed)
Med list updated. Stress test scheduled for 02/15/14.

## 2014-02-12 ENCOUNTER — Encounter (HOSPITAL_COMMUNITY): Payer: BC Managed Care – PPO | Attending: Cardiovascular Disease

## 2014-02-12 ENCOUNTER — Encounter (HOSPITAL_COMMUNITY): Payer: Self-pay | Admitting: General Practice

## 2014-02-12 ENCOUNTER — Inpatient Hospital Stay (HOSPITAL_COMMUNITY): Payer: BC Managed Care – PPO

## 2014-02-12 LAB — LIPID PANEL
Cholesterol: 178 mg/dL (ref 0–200)
HDL: 29 mg/dL — ABNORMAL LOW (ref >39)
LDL CALC: 99 mg/dL (ref 0–99)
Total CHOL/HDL Ratio: 6.1 RATIO
Triglycerides: 252 mg/dL — ABNORMAL HIGH (ref <150)
VLDL: 50 mg/dL — ABNORMAL HIGH (ref 0–40)

## 2014-02-12 LAB — BASIC METABOLIC PANEL
ANION GAP: 11 (ref 5–15)
BUN: 11 mg/dL (ref 6–23)
CHLORIDE: 107 meq/L (ref 96–112)
CO2: 24 mEq/L (ref 19–32)
Calcium: 8.8 mg/dL (ref 8.4–10.5)
Creatinine, Ser: 1.01 mg/dL (ref 0.50–1.35)
Glucose, Bld: 99 mg/dL (ref 70–99)
Potassium: 4.1 mEq/L (ref 3.7–5.3)
SODIUM: 142 meq/L (ref 137–147)

## 2014-02-12 LAB — PROTIME-INR
INR: 1.1 (ref 0.00–1.49)
Prothrombin Time: 14.2 seconds (ref 11.6–15.2)

## 2014-02-12 LAB — TROPONIN I
Troponin I: <0.3 ng/mL (ref <0.30)
Troponin I: <0.3 ng/mL (ref <0.30)

## 2014-02-12 LAB — HEPARIN LEVEL (UNFRACTIONATED)

## 2014-02-12 MED ORDER — INFLUENZA VAC SPLIT QUAD 0.5 ML IM SUSY: 0.5000 mL | PREFILLED_SYRINGE | INTRAMUSCULAR | Status: DC

## 2014-02-12 MED ORDER — TECHNETIUM TC 99M SESTAMIBI - CARDIOLITE
30.0000 | Freq: Once | INTRAVENOUS | Status: AC | PRN
  Administered 2014-02-12: 14:00:00 30 via INTRAVENOUS

## 2014-02-12 MED ORDER — HEPARIN BOLUS VIA INFUSION
3000.0000 [IU] | Freq: Once | INTRAVENOUS | Status: AC
  Administered 2014-02-12: 3000 [IU] via INTRAVENOUS
  Filled 2014-02-12: qty 3000

## 2014-02-12 MED ORDER — PNEUMOCOCCAL VAC POLYVALENT 25 MCG/0.5ML IJ INJ: 0.5000 mL | INJECTION | INTRAMUSCULAR | Status: DC

## 2014-02-12 MED ORDER — TECHNETIUM TC 99M SESTAMIBI - CARDIOLITE
10.0000 | Freq: Once | INTRAVENOUS | Status: AC | PRN
  Administered 2014-02-12: 13:00:00 10 via INTRAVENOUS

## 2014-02-12 MED ORDER — OXYCODONE HCL 5 MG PO TABS
5.0000 mg | ORAL_TABLET | Freq: Once | ORAL | Status: AC
  Administered 2014-02-12: 5 mg via ORAL
  Filled 2014-02-12: qty 1

## 2014-02-12 NOTE — Discharge Summary (Signed)
Physician Discharge Summary  Patient ID: Aaron [name] MRN: [id number] DOB/AGE: 1980/12/06 33 y.o.  Admit date: 02/11/2014 Discharge date: 02/12/2014  Admission Diagnoses: Chest pain r/o CAD  Hypertension  Hyperlipidemia  Tobacco use disorder  Discharge Diagnoses:  Principle Problem: * Chest pain at rest * Hypertension  Hyperlipidemia  Tobacco use disorder  Discharged Condition: good  Hospital Course: 33 year old male with uncontrolled hypertension, headache, dizziness has left sided chest pain, left arm pain and numbness. He underwent TMST with Cardiolite injection. No reversible ischemia with EF of 64 %. Fixed mild inferior wall defect appears to be artifact or diaphragmatic attenuation. His blood pressure improved with lisinopril use. He will be followed by me in office in 1 month.   Consults: cardiology  Significant Diagnostic Studies: labs: Normal. EKG- Sinus rhythm with incomplete RBBB. Nuclear stress test with normal wall motion and no reversible defect and fixed inferior wall defect.  Treatments: cardiac meds: lisinopril (Prinivil) and amlodipine  Discharge Exam: Blood pressure 110/74, pulse 80, temperature 98.1 F (36.7 C), temperature source Oral, resp. rate 16, height 5\' 11"  (1.803 m), weight 81.012 kg (178 lb 9.6 oz), SpO2 100.00%. General: Well built and nourished.  HEENT: Normocephalic, atraumatic, Blue eyes, Conj-pink, Sclera-white.Tongue-pink and midline.  Neck: No JVD, No bruit  Lungs: Clear, bil.  Heart: Normal S1 and S2. No murmur, gallop or rub.  Abdomen: Soft and non-tender.  Ext:- No E/C/C.  CNS: Cranial nerves grossly intact. Moves all four extremities.  Psych: Stable mood and affect.   Disposition: 01, Home or self care.     Medication List         amLODipine 10 MG tablet  Commonly known as:  NORVASC  Take 1 tablet (10 mg total) by mouth daily.     aspirin 81 MG tablet  Take 81 mg by mouth daily.     lisinopril 5 MG tablet  Commonly  known as:  PRINIVIL,ZESTRIL  Take 5 mg by mouth daily.     loperamide 2 MG capsule  Commonly known as:  IMODIUM  Take 2 mg by mouth 2 (two) times daily as needed for diarrhea or loose stools.     oxyCODONE-acetaminophen 5-325 MG per tablet  Commonly known as:  PERCOCET/ROXICET  Take 1 tablet by mouth every 8 (eight) hours.     rosuvastatin 5 MG tablet  Commonly known as:  CRESTOR  Take 5 mg by mouth every evening.           Follow-up Information   Follow up with Ria Bush, MD. Schedule an appointment as soon as possible for a visit in 1 month.   Specialty:  Family Medicine   Contact information:   [place] [address] [phone]       Follow up with [facility], MD. Schedule an appointment as soon as possible for a visit in 1 month.   Specialty:  Cardiology   Contact information:   [place] [address] [phone]       Signed: Birdie Riddle 02/12/2014, 5:53 PM

## 2014-02-12 NOTE — Progress Notes (Signed)
ANTICOAGULATION CONSULT NOTE - Follow Up Consult  Pharmacy Consult for Heparin  Indication: chest pain/ACS  Allergies  Allergen Reactions  . Sertraline Other (See Comments)    Bad thoughts, lethargic, self harm    Patient Measurements: Height: 5\' 11"  (180.3 cm) Weight: 178 lb 9.6 oz (81.012 kg) IBW/kg (Calculated) : 75.3 Heparin Dosing Weight: 81 kg  Vital Signs: Temp: 98.1 F (36.7 C) (09/12 1427) Temp src: Oral (09/12 1427) BP: 110/74 mmHg (09/12 1459) Pulse Rate: 80 (09/12 1459)  Labs:  Recent Labs  02/11/14 2100 02/12/14 0535 02/12/14 1006 02/12/14 1441  HGB 14.1  --   --   --   HCT 41.1  --   --   --   PLT 169  --   --   --   LABPROT 14.1 14.2  --   --   INR 1.09 1.10  --   --   HEPARINUNFRC  --  <0.10*  --  <0.10*  CREATININE 1.03 1.01  --   --   TROPONINI <0.30 <0.30 <0.30  --     Estimated Creatinine Clearance: 110.8 ml/min (by C-G formula based on Cr of 1.01).   Medications:  Heparin @ 1300 units/hr  Assessment: 12 YOM who continues on heparin for ACS while awaiting further cardiology work-up. Heparin level this afternoon remains SUBtherapeutic despite a rate increase this morning (HL <0.1, goal of 0.3-0.7). No CBC today - no s/sx of bleeding noted.   Goal of Therapy:  Heparin level 0.3-0.7 units/ml Monitor platelets by anticoagulation protocol: Yes   Plan:  1. Increase heparin to 1600 units/hr (16 ml/hr) 2. Will continue to monitor for any signs/symptoms of bleeding and will follow up with heparin level in 6 hours   Alycia Rossetti, PharmD, BCPS Clinical Pharmacist Pager: [phone] 02/12/2014 4:58 PM

## 2014-02-12 NOTE — Progress Notes (Signed)
Nutrition Brief Note  Patient identified on the Malnutrition Screening Tool (MST) Report for recent weight lost without trying.  Per readings below, patient has had a 5% weight loss since June 2015; not significant for time frame.  Wt Readings from Last 15 Encounters:  02/12/14 178 lb 9.6 oz (81.012 kg)  11/29/13 187 lb 8 oz (85.049 kg)  10/15/13 190 lb 12 oz (86.524 kg)  09/07/13 185 lb 8 oz (84.142 kg)  07/22/13 187 lb 4 oz (84.936 kg)    Body mass index is 24.92 kg/(m^2). Patient meets criteria for Normal based on current BMI.   Current diet order is NPO.  Labs and medications reviewed.   No nutrition interventions warranted at this time. If nutrition issues arise, please consult RD.   Arthur Holms, RD, LDN Pager #: [phone] After-Hours Pager #: [phone]

## 2014-02-12 NOTE — Progress Notes (Signed)
ANTICOAGULATION CONSULT NOTE - Follow Up Consult  Pharmacy Consult for heparin Indication: chest pain/ACS  Labs:  Recent Labs  02/11/14 2100 02/12/14 0535  HGB 14.1  --   HCT 41.1  --   PLT 169  --   LABPROT 14.1 14.2  INR 1.09 1.10  HEPARINUNFRC  --  <0.10*  CREATININE 1.03  --   TROPONINI <0.30  --     Assessment: 33yo male undetectable on heparin with initial dosing for CP.  Goal of Therapy:  Heparin level 0.3-0.7 units/ml   Plan:  Will rebolus with heparin 3000 units and increase gtt by 4 units/kg/hr to 1300 units/hr and check level in 6hr.  Wynona Neat, PharmD, BCPS  02/12/2014,6:27 AM

## 2014-02-12 NOTE — Progress Notes (Signed)
TC MD- pt c/o of left sided neck pain, Tylenol given earlier, ineffective pain 6/10 remains same. New order noted will cont to monitor

## 2014-02-13 ENCOUNTER — Encounter: Payer: Self-pay | Admitting: Family Medicine

## 2014-02-17 ENCOUNTER — Ambulatory Visit (INDEPENDENT_AMBULATORY_CARE_PROVIDER_SITE_OTHER): Payer: BC Managed Care – PPO | Admitting: Family Medicine

## 2014-02-17 ENCOUNTER — Encounter: Payer: Self-pay | Admitting: Family Medicine

## 2014-02-17 ENCOUNTER — Encounter: Payer: Self-pay | Admitting: Radiology

## 2014-02-17 VITALS — BP 104/66 | HR 82 | Temp 98.0°F | Wt 182.0 lb

## 2014-02-17 DIAGNOSIS — R079 Chest pain, unspecified: Secondary | ICD-10-CM

## 2014-02-17 DIAGNOSIS — M5416 Radiculopathy, lumbar region: Principal | ICD-10-CM

## 2014-02-17 DIAGNOSIS — IMO0002 Reserved for concepts with insufficient information to code with codable children: Secondary | ICD-10-CM

## 2014-02-17 DIAGNOSIS — G8929 Other chronic pain: Secondary | ICD-10-CM

## 2014-02-17 DIAGNOSIS — Z23 Encounter for immunization: Secondary | ICD-10-CM

## 2014-02-17 DIAGNOSIS — F17200 Nicotine dependence, unspecified, uncomplicated: Secondary | ICD-10-CM

## 2014-02-17 DIAGNOSIS — F4310 Post-traumatic stress disorder, unspecified: Secondary | ICD-10-CM

## 2014-02-17 DIAGNOSIS — I10 Essential (primary) hypertension: Secondary | ICD-10-CM

## 2014-02-17 MED ORDER — AMITRIPTYLINE HCL 25 MG PO TABS: 25.0000 mg | ORAL_TABLET | Freq: Every day | ORAL | Status: DC

## 2014-02-17 MED ORDER — ATORVASTATIN CALCIUM 20 MG PO TABS: 20.0000 mg | ORAL_TABLET | Freq: Every day | ORAL | Status: AC

## 2014-02-17 NOTE — Assessment & Plan Note (Signed)
Did not tolerate sertraline. Will trial amitriptyline 25mg  nightly for 1 wk then increase to 50mg  nightly. I've asked pt to call me in 3-4 wks with update.

## 2014-02-17 NOTE — Assessment & Plan Note (Signed)
Improved with addition of lisinopril 5mg  daily on top of amlodipine 10mg  daily. Continue this regimen.

## 2014-02-17 NOTE — Patient Instructions (Addendum)
Flu shot today. Let's start amitriptyline 25mg  at bedtime for mood and for sleep and for pain. After 4-7 days may increase to 2 tablets at bedtime. Call me in 3-4 weeks with mood update. Return for follow up in 4 months, sooner if needed. Pass by [name]'s office for MRI referral.

## 2014-02-17 NOTE — Progress Notes (Signed)
BP 104/66  Pulse 82  Temp(Src) 98 F (36.7 C) (Oral)  Wt 182 lb (82.555 kg)  SpO2 98%   CC:  F/u visit Subjective:    Patient ID: Aaron [name], male    DOB: [birth year], 33 y.o.   MRN: [id number]  HPI: ANTHON HARPOLE is a 33 y.o. male presenting on 02/17/2014 for Follow-up   Recent hospitalization with dyspnea and crushing chest pain, hypertensive urgency. Nuclear stress test Date: 02/2014 EF 64%, fixed inferior wall defect thought diaphragm/artifact Doylene Canard). ?panic attack. Has f/u with [facility] in 1 month. Labwork stable. Records reviewed.  Started on lisinopril 5mg  daily in addition to amlodipine 10mg  daily.  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 Requests to discontinue oxycodone. Requests updated MRI as he will be undergoing laser microdiscectomy at [facility] in [place] on undergoing this 05/2014. Needs MRI within the past year. Persistent chronic lower back pain with radiation down right leg, paresthesias and numbness.  No fevers/chills, bowel/bladder accidents, no saddle anesthesia or unilateral weakness.  Smoking - a few cigarettes a day.  Mood - not currently on any medication for this. Bad reaction to sertraline in the past.  Past Medical History  Diagnosis Date  . Hx of colonic polyps   . History of seizure disorder childhood    last seizure at 33 y/o, currently off meds (?febrile, heat induced)  . HTN (hypertension)   . Chronic diarrhea     thought due to IBS, s/p colonoscopies in past  . 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 Retained metal fragment 03/2012    R lat abd wall by CT scan  . Fatty  liver 03/2012    by CT, mild HSM  . Diverticulosis 03/2012    left sided by CT  . Chronic pain syndrome 2010    back injury    Past Surgical History  Procedure Laterality Date  . Appendectomy  2013    2014 repeat surgery for stump appendix (at [facility])  . Colonoscopy  06/2012    ext hemm, 1 rectal polyp ([facility]) [place]  . Colonoscopy  07/2013    hypertrophied anal papillae, hemm, normal biopsies ([facility])  . Esi  2012    effective x1 mo  . Discogram      L2/3, L3/4, L5/S1 HNP with pinched nerves  . Nuclear stress test  02/2014    EF 64%, fixed inferior wall defect thought diaphragm/artifact Doylene Canard)   Relevant past medical, surgical, family and social history reviewed and updated as indicated.  Allergies and medications reviewed and updated. Current Outpatient Prescriptions on File Prior to Visit  Medication Sig  . amLODipine (NORVASC) 10 MG tablet Take 1 tablet (10 mg total) by mouth daily.  Marland Kitchen aspirin 81 MG tablet Take 81 mg by mouth daily.  Marland Kitchen lisinopril (PRINIVIL,ZESTRIL) 5 MG tablet Take 5 mg by mouth daily.  Marland Kitchen loperamide (IMODIUM) 2 MG capsule Take 2 mg by mouth 2 (two) times daily as needed for diarrhea or loose stools.    No current facility-administered medications on file prior to visit.    Review of Systems Per HPI unless  specifically indicated above    Objective:    BP 104/66  Pulse 82  Temp(Src) 98 F (36.7 C) (Oral)  Wt 182 lb (82.555 kg)  SpO2 98%  Physical Exam  Nursing note and vitals reviewed. Constitutional: He appears well-developed and well-nourished. No distress.  HENT:  Mouth/Throat: Oropharynx is clear and moist. No oropharyngeal exudate.  Eyes: Conjunctivae and EOM are normal. Pupils are equal, round, and reactive to light. No scleral icterus.  Neck: Normal range of motion.  Cardiovascular: Normal rate, regular rhythm, normal heart sounds and intact distal pulses.   No murmur heard. Pulmonary/Chest: Effort normal and breath sounds  normal. No respiratory distress. He has no wheezes. He has no rales.  Musculoskeletal: He exhibits no edema.  + midline spine tenderness upper lumbar region and paraspinous mm tenderness  Neurological: He has normal strength. No sensory deficit.  Reflex Scores:      Patellar reflexes are 2+ on the right side and 2+ on the left side. Strength 5/5 BLE       Assessment & Plan:   Problem List Items Addressed This Visit   Smoker     Continue to encourage cessation.    PTSD (post-traumatic stress disorder)     Did not tolerate sertraline. Will trial amitriptyline 25mg  nightly for 1 wk then increase to 50mg  nightly. I've asked pt to call me in 3-4 wks with update.    HTN (hypertension)     Improved with addition of lisinopril 5mg  daily on top of amlodipine 10mg  daily. Continue this regimen.    Relevant Medications      atorvastatin (LIPITOR) tablet   Chronic radicular pain of lower back - Primary     Known chronic right lumbar radiculopathy, last MRI 2 yrs ago with diffuse lower lumbar DDD and pinching of nerve roots. Requests to come off narcotic completely. Pt planning surgery - will update MRI in anticipation of upcoming surgery in [place].    Relevant Medications      amitriptyline (ELAVIL) tablet   Other Relevant Orders      MR Lumbar Spine Wo Contrast   Chest pain at rest     Presented to ER with hypertensive crisis and chest pain, s/p benign workup. Stress test with presumed diaphragmatic attenuation otherwise normal. Has f/u scheduled with cards. Appreciate their care of patient.        Follow up plan: Return in about 4 months (around 06/19/2014), or as needed, for follow up visit.

## 2014-02-17 NOTE — Assessment & Plan Note (Signed)
Continue to encourage cessation. 

## 2014-02-17 NOTE — Addendum Note (Signed)
Addended by: Tammi Sou on: 02/17/2014 02:56 PM   Modules accepted: Orders

## 2014-02-17 NOTE — Progress Notes (Signed)
Pre visit review using our clinic review tool, if applicable. No additional management support is needed unless otherwise documented below in the visit note. 

## 2014-02-17 NOTE — Assessment & Plan Note (Signed)
Presented to ER with hypertensive crisis and chest pain, s/p benign workup. Stress test with presumed diaphragmatic attenuation otherwise normal. Has f/u scheduled with cards. Appreciate their care of patient.

## 2014-02-17 NOTE — Assessment & Plan Note (Addendum)
Known chronic right lumbar radiculopathy, last MRI 2 yrs ago with diffuse lower lumbar DDD and pinching of nerve roots. Requests to come off narcotic completely. Pt planning surgery - will update MRI in anticipation of upcoming surgery in [place].

## 2014-02-24 ENCOUNTER — Ambulatory Visit
Admission: RE | Admit: 2014-02-24 | Discharge: 2014-02-24 | Disposition: A | Discharge status: Home / Self Care | Payer: BC Managed Care – PPO | Source: Ambulatory Visit | Attending: Family Medicine | Admitting: Family Medicine

## 2014-02-24 DIAGNOSIS — M5416 Radiculopathy, lumbar region: Principal | ICD-10-CM

## 2014-02-24 DIAGNOSIS — G8929 Other chronic pain: Secondary | ICD-10-CM

## 2014-02-27 ENCOUNTER — Encounter: Payer: Self-pay | Admitting: Family Medicine

## 2014-03-31 ENCOUNTER — Ambulatory Visit: Payer: BC Managed Care – PPO | Admitting: Family Medicine

## 2014-05-17 ENCOUNTER — Encounter: Payer: Self-pay | Admitting: Family Medicine

## 2014-05-17 ENCOUNTER — Ambulatory Visit (INDEPENDENT_AMBULATORY_CARE_PROVIDER_SITE_OTHER): Payer: BC Managed Care – PPO | Admitting: Family Medicine

## 2014-05-17 VITALS — BP 148/88 | HR 96 | Temp 98.1°F | Wt 183.0 lb

## 2014-05-17 DIAGNOSIS — M5416 Radiculopathy, lumbar region: Principal | ICD-10-CM

## 2014-05-17 DIAGNOSIS — F17200 Nicotine dependence, unspecified, uncomplicated: Secondary | ICD-10-CM

## 2014-05-17 DIAGNOSIS — G894 Chronic pain syndrome: Secondary | ICD-10-CM

## 2014-05-17 DIAGNOSIS — Z720 Tobacco use: Secondary | ICD-10-CM

## 2014-05-17 DIAGNOSIS — I10 Essential (primary) hypertension: Secondary | ICD-10-CM

## 2014-05-17 DIAGNOSIS — G8929 Other chronic pain: Secondary | ICD-10-CM

## 2014-05-17 DIAGNOSIS — M5410 Radiculopathy, site unspecified: Secondary | ICD-10-CM

## 2014-05-17 DIAGNOSIS — F4310 Post-traumatic stress disorder, unspecified: Secondary | ICD-10-CM

## 2014-05-17 MED ORDER — HYDROCODONE-ACETAMINOPHEN 5-325 MG PO TABS: 1.0000 | ORAL_TABLET | Freq: Three times a day (TID) | ORAL | Status: DC | PRN

## 2014-05-17 MED ORDER — AMITRIPTYLINE HCL 50 MG PO TABS: 50.0000 mg | ORAL_TABLET | Freq: Every day | ORAL | Status: DC

## 2014-05-17 NOTE — Assessment & Plan Note (Signed)
Pt states tolerating amitriptyline, endorses stable mood. rec increase amitriptyline to 50mg  nightly.

## 2014-05-17 NOTE — Patient Instructions (Addendum)
May restart pain regimen - continue motrin to start for back pain, start hydrocodone 5/325mg  three times daily as needed #50 provided today for the next month. Pass by [name]'s office to schedule neurosurgery referral. Increase amitriptyline to 50mg  nightly

## 2014-05-17 NOTE — Assessment & Plan Note (Signed)
Restart narcotic pain regimen - prescribed hydrocodone 5/325mg  today TID PRN #50 for next month. Has completed controlled substance agreement 07/2013. Will be due for recheck 07/2014.

## 2014-05-17 NOTE — Assessment & Plan Note (Signed)
Doing very well - down to <1 cig/day, wants to recruit wife to quit as well. Encouraged continued cessation attempt.

## 2014-05-17 NOTE — Progress Notes (Signed)
BP 148/88 mmHg  Pulse 96  Temp(Src) 98.1 F (36.7 C) (Oral)  Wt 183 lb (83.008 kg)   CC: 77mo f/u back pain  Subjective:    Patient ID: Aaron [name], male    DOB: April 28, 1981, 33 y.o.   MRN: [id number]  HPI: Aaron [name] is a 33 y.o. male presenting on 05/17/2014 for Follow-up   HTN - bp running well at home. Takes lisinopril 5mg  QOD. Takes amlodipine 10mg  every morning. No HA, vision changes, CP/tightness, SOB, leg swelling.   Smoking - down to 1 cig/day or less.  PTSD - feels mood stable. Takes amitriptyline 25mg  nightly.  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 Was planning on going to [facility] for laser microdiscectomy but insurance stated pt would have to pay $10,000 out of pocket (?out of network). He is unable to afford this. Last visit requested to discontinue oxycodone. Persistent chronic lower back pain with radiation down right leg, paresthesias and numbness.   Today requests referral to local neurosurgeon to establish. Requests recommencing narcotic pain regimen. Currently takes motrin 800mg  prn pain.  MRI LUMBAR SPINE WITHOUT CONTRAST TECHNIQUE: Multiplanar, multisequence MR imaging of the lumbar spine was performed. No intravenous contrast was administered. COMPARISON: CT scan 06/28/2013  FINDINGS: Normal alignment of the lumbar vertebral bodies. They demonstrate normal marrow signal. The intervertebral disc spaces demonstrate normal T2 signal intensity. Mild disc space narrowing noted at T12-L1 and L5-S1. The conus medullaris terminates at L1. The facets are normally aligned. No pars defects. No significant paraspinal or retroperitoneal findings. T12-L1: Shallow right paracentral disc protrusion with mild impression on the right side of the thecal sac. No foraminal stenosis. L1-2: No significant findings. L2-3: Mild hypertrophic facet  changes, left greater than right. No focal disc protrusion, spinal or foraminal stenosis. Minimal lateral recess encroachment bilaterally. L3-4: No disc protrusions, significant spinal or foraminal stenosis. Short pedicles are noted with mild lateral recess encroachment bilaterally. L4-5: Minimal annular bulge. Mild impression on the ventral thecal sac. The pedicles are also short and there is mild facet disease but no significant spinal stenosis. Mild lateral recess encroachment bilaterally. No foraminal stenosis. L5-S1: Mild disc desiccation and degeneration. There is a bulging annulus and small central disc protrusion. Mild canal narrowing due to short pedicles. No foraminal stenosis.  IMPRESSION: 1. Shallow right paracentral disc protrusion at T12-L1. 2. 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 There is mild lateral recess encroachment at L2-3, L3-4 and L4-5. 3. Mild disc desiccation and degeneration at L5-S1. There is a mild bulging annulus and small central disc protrusion. Mild canal stenosis. No foraminal stenosis. Electronically Signed  By: Kalman Jewels M.D.  On: 02/24/2014 10:05  Relevant past medical, surgical, family and social history reviewed and updated as indicated. Interim medical history since our last visit reviewed. Allergies and medications reviewed and updated. Current Outpatient Prescriptions on File Prior to Visit  Medication Sig  . amLODipine (NORVASC) 10 MG tablet Take 1 tablet (10 mg total) by mouth daily.  Marland Kitchen aspirin 81 MG tablet Take 81 mg by mouth daily.  Marland Kitchen atorvastatin (LIPITOR) 20 MG tablet Take 1 tablet (20 mg total) by mouth daily.  Marland Kitchen lisinopril (PRINIVIL,ZESTRIL) 5 MG tablet Take 5 mg by mouth daily.  Marland Kitchen loperamide (IMODIUM) 2 MG capsule Take 2 mg by mouth 2 (two) times daily as needed for diarrhea or loose stools.  No current facility-administered medications on file prior to visit.   Past Medical History  Diagnosis  Date  . Hx of colonic polyps   . History of seizure disorder childhood    last seizure at 33 y/o, currently off meds (?febrile, heat induced)  . HTN (hypertension)   . Chronic diarrhea     thought due to IBS, s/p colonoscopies in past  . 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 Retained metal fragment 03/2012    R lat abd wall by CT scan  . Fatty liver 03/2012    by CT, mild HSM  . Diverticulosis 03/2012    left sided by CT  . Chronic pain syndrome 2010    back injury  . Smoker     Past Surgical History  Procedure Laterality Date  . Appendectomy  2013    2014 repeat surgery for stump appendix (at [facility])  . Colonoscopy  06/2012    ext hemm, 1 rectal polyp (TA) [place] [facility] [place]  . Colonoscopy  07/2013    hypertrophied anal papillae, hemm, normal biopsies ([facility])  . Esi  2012    effective x1 mo  . Discogram      L2/3, L3/4, L5/S1 HNP with pinched nerves  . Nuclear stress test  02/2014    EF 64%, fixed inferior wall defect thought diaphragm/artifact Doylene Canard)  . Mri  02/2014    mild congenital narrowing of spine as well as mild bulging disc at L5/S1   History  Substance Use Topics  . Smoking status: Current Some Day Smoker    Types: Cigarettes    Start date: 06/03/1996  . Smokeless tobacco: Never Used     Comment: 3 cigarettes/day  . Alcohol Use: 0.0 oz/week    0 Not specified per week     Comment: Rare   Review of Systems Per HPI unless specifically indicated above     Objective:    BP 148/88 mmHg  Pulse 96  Temp(Src) 98.1 F (36.7 C) (Oral)  Wt 183 lb (83.008 kg)  Wt Readings from Last 3 Encounters:  05/17/14 183 lb (83.008 kg)  02/17/14 182 lb (82.555 kg)  02/12/14 178 lb 9.6 oz (81.012 kg)    Physical Exam  Constitutional: He appears well-developed and well-nourished. No distress.  HENT:  Mouth/Throat: Oropharynx is clear and  moist. No oropharyngeal exudate.  Cardiovascular: Normal rate, regular rhythm, normal heart sounds and intact distal pulses.   No murmur heard. Pulmonary/Chest: Effort normal and breath sounds normal. No respiratory distress. He has no wheezes. He has no rales.  Musculoskeletal: He exhibits no edema.  Psychiatric: His mood appears anxious.  Restless at baseline  Nursing note and vitals reviewed.      Assessment & Plan:   Problem List Items Addressed This Visit    Smoker    Doing very well - down to <1 cig/day, wants to recruit wife to quit as well. Encouraged continued cessation attempt.    PTSD (post-traumatic stress disorder)    Pt states tolerating amitriptyline, endorses stable mood. rec increase amitriptyline to 50mg  nightly.    HTN (hypertension)    Mildly elevated today, continue regimen. Takes lisinopril 5mg  QOD along with amlodipine 10mg  daily.    Chronic radicular pain of lower back - Primary    Known chronic R lumbar radiculopathy. 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 S/p ESI not as effective as prior. Needing chronic narcotic therapy. Will refer to neurosurgery for further eval/discussion of other possible treatment options.    Relevant Medications      amitriptyline (ELAVIL) tablet   Chronic pain syndrome    Restart narcotic pain regimen - prescribed hydrocodone 5/325mg  today TID PRN #50 for next month. Has completed controlled substance agreement 07/2013. Will be due for recheck 07/2014.        Follow up plan: Return in about 4 months (around 09/16/2014), or as needed, for follow up visit.

## 2014-05-17 NOTE — Assessment & Plan Note (Signed)
Known chronic R lumbar radiculopathy. 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 S/p ESI not as effective as prior. Needing chronic narcotic therapy. Will refer to neurosurgery for further eval/discussion of other possible treatment options.

## 2014-05-17 NOTE — Progress Notes (Signed)
Pre visit review using our clinic review tool, if applicable. No additional management support is needed unless otherwise documented below in the visit note. 

## 2014-05-17 NOTE — Assessment & Plan Note (Signed)
Mildly elevated today, continue regimen. Takes lisinopril 5mg  QOD along with amlodipine 10mg  daily.

## 2014-06-14 ENCOUNTER — Other Ambulatory Visit: Payer: Self-pay | Admitting: *Deleted

## 2014-06-15 MED ORDER — HYDROCODONE-ACETAMINOPHEN 5-325 MG PO TABS: 1.0000 | ORAL_TABLET | Freq: Three times a day (TID) | ORAL | Status: DC | PRN

## 2014-06-15 NOTE — Telephone Encounter (Signed)
Patient's wife notified and Rx placed up front for pick up. 

## 2014-06-15 NOTE — Telephone Encounter (Signed)
Printed and in Kim's box 

## 2014-06-20 ENCOUNTER — Ambulatory Visit: Payer: BC Managed Care – PPO | Admitting: Family Medicine

## 2014-06-22 ENCOUNTER — Ambulatory Visit: Payer: Self-pay | Admitting: Family Medicine

## 2014-06-30 ENCOUNTER — Ambulatory Visit (INDEPENDENT_AMBULATORY_CARE_PROVIDER_SITE_OTHER): Payer: BLUE CROSS/BLUE SHIELD | Admitting: Family Medicine

## 2014-06-30 ENCOUNTER — Encounter: Payer: Self-pay | Admitting: Family Medicine

## 2014-06-30 ENCOUNTER — Encounter: Payer: Self-pay | Admitting: *Deleted

## 2014-06-30 VITALS — BP 134/86 | HR 88 | Temp 98.2°F | Wt 185.5 lb

## 2014-06-30 DIAGNOSIS — M5416 Radiculopathy, lumbar region: Secondary | ICD-10-CM

## 2014-06-30 DIAGNOSIS — R072 Precordial pain: Secondary | ICD-10-CM

## 2014-06-30 DIAGNOSIS — Z8249 Family history of ischemic heart disease and other diseases of the circulatory system: Secondary | ICD-10-CM

## 2014-06-30 DIAGNOSIS — Z720 Tobacco use: Secondary | ICD-10-CM

## 2014-06-30 DIAGNOSIS — K219 Gastro-esophageal reflux disease without esophagitis: Secondary | ICD-10-CM

## 2014-06-30 DIAGNOSIS — E785 Hyperlipidemia, unspecified: Secondary | ICD-10-CM | POA: Insufficient documentation

## 2014-06-30 DIAGNOSIS — M5410 Radiculopathy, site unspecified: Secondary | ICD-10-CM

## 2014-06-30 DIAGNOSIS — I10 Essential (primary) hypertension: Secondary | ICD-10-CM

## 2014-06-30 DIAGNOSIS — F4310 Post-traumatic stress disorder, unspecified: Secondary | ICD-10-CM

## 2014-06-30 DIAGNOSIS — F17200 Nicotine dependence, unspecified, uncomplicated: Secondary | ICD-10-CM

## 2014-06-30 DIAGNOSIS — G8929 Other chronic pain: Secondary | ICD-10-CM

## 2014-06-30 DIAGNOSIS — G894 Chronic pain syndrome: Secondary | ICD-10-CM

## 2014-06-30 HISTORY — DX: Hyperlipidemia, unspecified: E78.5

## 2014-06-30 MED ORDER — OMEPRAZOLE 20 MG PO CPDR: 20.0000 mg | DELAYED_RELEASE_CAPSULE | Freq: Every day | ORAL | Status: DC

## 2014-06-30 MED ORDER — OMEPRAZOLE 40 MG PO CPDR: 40.0000 mg | DELAYED_RELEASE_CAPSULE | Freq: Every day | ORAL | Status: DC

## 2014-06-30 NOTE — Progress Notes (Signed)
BP 134/86 mmHg  Pulse 88  Temp(Src) 98.2 F (36.8 C) (Oral)  Wt 185 lb 8 oz (84.142 kg)   CC: f/u visit  Subjective:    Patient ID: Aaron [name], male    DOB: Nov 29, 1980, 34 y.o.   MRN: [id number]  HPI: Aaron [name] is a 35 y.o. male presenting on 06/30/2014 for Follow-up   Ongoing chest pain worsening over last few weeks - deep to chest, sharp stabbing in nature. Happens at rest and also when walking up flight of stairs, or with physical exercise. Associated with dyspnea with exertion. Denies acid reflux, water brash, heartburn. Radiates up to neck. Not reproducible with palpation. No thoracic back pain. Strong fmhx premature CAD - father with massive MI at age 75yo, deceased.  Seen at ER 02-22-2014 for similar sxs, s/p nuclear stress test:  nuclear stress test Date: 02/2014 EF 64%, fixed inferior wall defect thought diaphragm/artifact Doylene Canard)  HLD - compliant with lipitor 20mg  nightly, LDL 99 last check.  HTN - bp running well at home. Occasionally elevated readings. Takes lisinopril 5mg  QOD at night time. Takes amlodipine 10mg  every morning. No HA, vision changes, leg swelling.   Smoking - down to 1 cig/day or less.  PTSD - feels mood stable. Takes amitriptyline 50mg  nightly. Does not feel overwhelmed or stressed.  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 Was planning on going to [facility] for laser microdiscectomy but insurance stated pt would have to pay $10,000 out of pocket (?out of network). He is unable to afford this. Persistent chronic lower back pain with radiation down right leg, paresthesias and numbness.  Has appt with NSG next week to establish locally. On hydrocodone 5/325mg  #50 per month, uses a few every few days, not regularly. Uses motrin more regularly 800mg  bid. Discussed PPI preventatively.  Relevant past medical, surgical, family and social history reviewed and updated as indicated. Interim medical history since our  last visit reviewed. Allergies and medications reviewed and updated. Current Outpatient Prescriptions on File Prior to Visit  Medication Sig  . amitriptyline (ELAVIL) 50 MG tablet Take 1 tablet (50 mg total) by mouth at bedtime.  Marland Kitchen amLODipine (NORVASC) 10 MG tablet Take 1 tablet (10 mg total) by mouth daily.  Marland Kitchen aspirin 81 MG tablet Take 81 mg by mouth daily.  Marland Kitchen atorvastatin (LIPITOR) 20 MG tablet Take 1 tablet (20 mg total) by mouth daily. (Patient taking differently: Take 20 mg by mouth daily at 6 PM. )  . HYDROcodone-acetaminophen (NORCO/VICODIN) 5-325 MG per tablet Take 1 tablet by mouth every 8 (eight) hours as needed for moderate pain.  Marland Kitchen ibuprofen (ADVIL,MOTRIN) 800 MG tablet Take 800 mg by mouth 2 (two) times daily as needed.  Marland Kitchen lisinopril (PRINIVIL,ZESTRIL) 5 MG tablet Take 5 mg by mouth every other day.   . loperamide (IMODIUM) 2 MG capsule Take 2 mg by mouth 2 (two) times daily as needed for diarrhea or loose stools.    No current facility-administered medications on file prior to visit.    Review of Systems Per HPI unless specifically indicated above     Objective:    BP 134/86 mmHg  Pulse 88  Temp(Src) 98.2 F (36.8 C) (Oral)  Wt 185 lb 8 oz (84.142 kg)  Wt Readings from Last 3 Encounters:  06/30/14 185 lb 8 oz (84.142 kg)  05/17/14 183 lb (83.008 kg)  02/17/14 182 lb (82.555 kg)    Physical Exam  Constitutional:  He appears well-developed and well-nourished. No distress.  HENT:  Mouth/Throat: Oropharynx is clear and moist. No oropharyngeal exudate.  Eyes: Conjunctivae and EOM are normal. Pupils are equal, round, and reactive to light. No scleral icterus.  Neck: Normal range of motion. Neck supple.  Cardiovascular: Normal rate, regular rhythm, normal heart sounds and intact distal pulses.   No murmur heard. Pulmonary/Chest: Effort normal and breath sounds normal. No respiratory distress. He has no wheezes. He has no rales. He exhibits no tenderness.  No  reproducible chest wall tenderness  Musculoskeletal: He exhibits no edema.  Lymphadenopathy:    He has no cervical adenopathy.  Nursing note and vitals reviewed.  Results for orders placed or performed during the hospital encounter of 02/11/14  Comprehensive metabolic panel  Result Value Ref Range   Sodium 140 137 - 147 mEq/L   Potassium 3.8 3.7 - 5.3 mEq/L   Chloride 102 96 - 112 mEq/L   CO2 26 19 - 32 mEq/L   Glucose, Bld 80 70 - 99 mg/dL   BUN 11 6 - 23 mg/dL   Creatinine, Ser 1.03 0.50 - 1.35 mg/dL   Calcium 9.1 8.4 - 10.5 mg/dL   Total Protein 6.6 6.0 - 8.3 g/dL   Albumin 3.8 3.5 - 5.2 g/dL   AST 21 0 - 37 U/L   ALT 28 0 - 53 U/L   Alkaline Phosphatase 61 39 - 117 U/L   Total Bilirubin 0.3 0.3 - 1.2 mg/dL   GFR calc non Af Amer >90 >90 mL/min   GFR calc Af Amer >90 >90 mL/min   Anion gap 12 5 - 15  Troponin I-(serum)  Result Value Ref Range   Troponin I <0.30 <0.30 ng/mL  Troponin I-(serum)  Result Value Ref Range   Troponin I <0.30 <0.30 ng/mL  Troponin I-(serum)  Result Value Ref Range   Troponin I <0.30 <0.30 ng/mL  Protime-INR  Result Value Ref Range   Prothrombin Time 14.1 11.6 - 15.2 seconds   INR 1.09 0.00 - 1.49  CBC WITH DIFFERENTIAL  Result Value Ref Range   WBC 6.2 4.0 - 10.5 K/uL   RBC 4.63 4.22 - 5.81 MIL/uL   Hemoglobin 14.1 13.0 - 17.0 g/dL   HCT 41.1 39.0 - 52.0 %   MCV 88.8 78.0 - 100.0 fL   MCH 30.5 26.0 - 34.0 pg   MCHC 34.3 30.0 - 36.0 g/dL   RDW 12.6 11.5 - 15.5 %   Platelets 169 150 - 400 K/uL   Neutrophils Relative % 50 43 - 77 %   Neutro Abs 3.1 1.7 - 7.7 K/uL   Lymphocytes Relative 39 12 - 46 %   Lymphs Abs 2.4 0.7 - 4.0 K/uL   Monocytes Relative 8 3 - 12 %   Monocytes Absolute 0.5 0.1 - 1.0 K/uL   Eosinophils Relative 2 0 - 5 %   Eosinophils Absolute 0.2 0.0 - 0.7 K/uL   Basophils Relative 1 0 - 1 %   Basophils Absolute 0.0 0.0 - 0.1 K/uL  Basic metabolic panel  Result Value Ref Range   Sodium 142 137 - 147 mEq/L    Potassium 4.1 3.7 - 5.3 mEq/L   Chloride 107 96 - 112 mEq/L   CO2 24 19 - 32 mEq/L   Glucose, Bld 99 70 - 99 mg/dL   BUN 11 6 - 23 mg/dL   Creatinine, Ser 1.01 0.50 - 1.35 mg/dL   Calcium 8.8 8.4 - 10.5 mg/dL   GFR calc  non Af Amer >90 >90 mL/min   GFR calc Af Amer >90 >90 mL/min   Anion gap 11 5 - 15  Protime-INR  Result Value Ref Range   Prothrombin Time 14.2 11.6 - 15.2 seconds   INR 1.10 0.00 - 1.49  Heparin level (unfractionated)  Result Value Ref Range   Heparin Unfractionated <0.10 (L) 0.30 - 0.70 IU/mL  Lipid panel  Result Value Ref Range   Cholesterol 178 0 - 200 mg/dL   Triglycerides 252 (H) <150 mg/dL   HDL 29 (L) >39 mg/dL   Total CHOL/HDL Ratio 6.1 RATIO   VLDL 50 (H) 0 - 40 mg/dL   LDL Cholesterol 99 0 - 99 mg/dL  Heparin level (unfractionated)  Result Value Ref Range   Heparin Unfractionated <0.10 (L) 0.30 - 0.70 IU/mL      Assessment & Plan:   Problem List Items Addressed This Visit    Smoker    Has almost quit - down to < 1 cig/day.      PTSD (post-traumatic stress disorder)    Stable on amlitriptyline - continue.      HTN (hypertension)    Isolated elevated readings to 150s at home. Discussed changing lisinopril to 5mg  QOD in am with amlodipine 10mg . If persistent elevated readings increase lisinopril to 5mg  daily.      Relevant Orders   Ambulatory referral to Cardiology   HLD (hyperlipidemia)    Complaint with lipitor 20mg  nightly.      Relevant Orders   Ambulatory referral to Cardiology   GERD (gastroesophageal reflux disease)    Start 40mg  omeprazole preventatively while using NSAIDs.      Relevant Medications   omeprazole (PRILOSEC) capsule   Chronic radicular pain of lower back    Has appt next week to establish with Neurosurgery.      Chronic pain syndrome    Doing well on narcotic + NSAID. Continue. Add PPI preventatively given regular motrin use and occasional GERD sxs.      Chest pain - Primary    Chest pain presented  initially during hypertensive crisis 02/2014. Did not f/u with cards.  Now bp better controlled but noticing recurrent chest pain associated with DOE.  Strong fmhx. LDL better with lipitor. Will refer back to cards to re establish and for further eval of recurrent chest pain. Advised no exertion until seen by cards - letter for work provided today. No current chest pain. Pt agrees with plan.      Relevant Orders   Ambulatory referral to Cardiology    Other Visit Diagnoses    Family history of premature CAD        Relevant Orders    Ambulatory referral to Cardiology        Follow up plan: Return in about 3 months (around 09/29/2014), or if symptoms worsen or fail to improve, for follow up visit.

## 2014-06-30 NOTE — Progress Notes (Signed)
Pre visit review using our clinic review tool, if applicable. No additional management support is needed unless otherwise documented below in the visit note. 

## 2014-06-30 NOTE — Patient Instructions (Addendum)
Try omeprazole 40mg  daily preventatively to help protect stomach while you're on motrin. Take daily for 3 weeks. Change lisinopril to 5mg  in the morning. Start every other day, if persistently elevated increase to daily. Keep appointment with neurosurgeon next week. Pass by [name]'s office to set up appointment with Dr Doylene Canard in next week or so.

## 2014-06-30 NOTE — Assessment & Plan Note (Signed)
Doing well on narcotic + NSAID. Continue. Add PPI preventatively given regular motrin use and occasional GERD sxs.

## 2014-06-30 NOTE — Assessment & Plan Note (Signed)
Has appt next week to establish with Neurosurgery.

## 2014-06-30 NOTE — Assessment & Plan Note (Signed)
Isolated elevated readings to 150s at home. Discussed changing lisinopril to 5mg  QOD in am with amlodipine 10mg . If persistent elevated readings increase lisinopril to 5mg  daily.

## 2014-06-30 NOTE — Assessment & Plan Note (Signed)
Chest pain presented initially during hypertensive crisis 02/2014. Did not f/u with cards.  Now bp better controlled but noticing recurrent chest pain associated with DOE.  Strong fmhx. LDL better with lipitor. Will refer back to cards to re establish and for further eval of recurrent chest pain. Advised no exertion until seen by cards - letter for work provided today. No current chest pain. Pt agrees with plan.

## 2014-06-30 NOTE — Assessment & Plan Note (Signed)
Stable on amlitriptyline - continue.

## 2014-06-30 NOTE — Assessment & Plan Note (Signed)
Start 40mg  omeprazole preventatively while using NSAIDs.

## 2014-06-30 NOTE — Assessment & Plan Note (Signed)
Has almost quit - down to < 1 cig/day.

## 2014-06-30 NOTE — Assessment & Plan Note (Signed)
Complaint with lipitor 20mg  nightly.

## 2014-07-04 ENCOUNTER — Other Ambulatory Visit: Payer: Self-pay | Admitting: Cardiovascular Disease

## 2014-07-04 ENCOUNTER — Telehealth: Payer: Self-pay | Admitting: Family Medicine

## 2014-07-04 ENCOUNTER — Ambulatory Visit
Admission: RE | Admit: 2014-07-04 | Discharge: 2014-07-04 | Disposition: A | Discharge status: Home / Self Care | Payer: BLUE CROSS/BLUE SHIELD | Source: Ambulatory Visit | Attending: Cardiovascular Disease | Admitting: Cardiovascular Disease

## 2014-07-04 DIAGNOSIS — R0602 Shortness of breath: Secondary | ICD-10-CM

## 2014-07-04 NOTE — Telephone Encounter (Signed)
Pt's wife called in stating that they found out from the cardio dr that pt has pnemonia (chest xray) .pt wants to know if it is okay to work, is contagious, etc... Best number [phone]

## 2014-07-04 NOTE — Telephone Encounter (Signed)
Spoke with patient's wife and she said he is really only coughing in the mornings. Cards put him abx and he follows up with them in 2 weeks for stress test. She will find out for sure if they will do repeat CXR in 6 weeks or if they want him to follow up here and then will schedule appt if needed.

## 2014-07-04 NOTE — Telephone Encounter (Signed)
Is he coughing? As long as not coughing shouldn't be contagious.  rec universal precautions, covering mouth with sleeve if coughing, or wearing mask.  Depending on how he's feeling ok to go to work. Is he following up with [place] or with cardiologist?

## 2014-07-06 ENCOUNTER — Emergency Department: Payer: Self-pay | Admitting: Emergency Medicine

## 2014-07-06 ENCOUNTER — Observation Stay (HOSPITAL_COMMUNITY)
Admission: AD | Admit: 2014-07-06 | Discharge: 2014-07-07 | Disposition: A | Discharge status: Home / Self Care | Payer: BLUE CROSS/BLUE SHIELD | Source: Ambulatory Visit | Attending: Cardiovascular Disease | Admitting: Cardiovascular Disease

## 2014-07-06 ENCOUNTER — Observation Stay (HOSPITAL_COMMUNITY): Payer: BLUE CROSS/BLUE SHIELD

## 2014-07-06 DIAGNOSIS — E876 Hypokalemia: Secondary | ICD-10-CM | POA: Insufficient documentation

## 2014-07-06 DIAGNOSIS — R079 Chest pain, unspecified: Principal | ICD-10-CM | POA: Insufficient documentation

## 2014-07-06 DIAGNOSIS — I10 Essential (primary) hypertension: Secondary | ICD-10-CM | POA: Insufficient documentation

## 2014-07-06 DIAGNOSIS — K760 Fatty (change of) liver, not elsewhere classified: Secondary | ICD-10-CM | POA: Insufficient documentation

## 2014-07-06 DIAGNOSIS — F419 Anxiety disorder, unspecified: Secondary | ICD-10-CM | POA: Diagnosis not present

## 2014-07-06 DIAGNOSIS — G894 Chronic pain syndrome: Secondary | ICD-10-CM | POA: Insufficient documentation

## 2014-07-06 DIAGNOSIS — Z79899 Other long term (current) drug therapy: Secondary | ICD-10-CM | POA: Diagnosis not present

## 2014-07-06 DIAGNOSIS — R0602 Shortness of breath: Secondary | ICD-10-CM | POA: Diagnosis present

## 2014-07-06 DIAGNOSIS — K589 Irritable bowel syndrome without diarrhea: Secondary | ICD-10-CM | POA: Diagnosis not present

## 2014-07-06 DIAGNOSIS — Z888 Allergy status to other drugs, medicaments and biological substances status: Secondary | ICD-10-CM | POA: Diagnosis not present

## 2014-07-06 DIAGNOSIS — J189 Pneumonia, unspecified organism: Secondary | ICD-10-CM | POA: Diagnosis present

## 2014-07-06 DIAGNOSIS — E785 Hyperlipidemia, unspecified: Secondary | ICD-10-CM | POA: Diagnosis not present

## 2014-07-06 DIAGNOSIS — F17200 Nicotine dependence, unspecified, uncomplicated: Secondary | ICD-10-CM | POA: Diagnosis not present

## 2014-07-06 LAB — CBC WITH DIFFERENTIAL/PLATELET
BASOS PCT: 0 % (ref 0–1)
Basophils Absolute: 0 10*3/uL (ref 0.0–0.1)
Eosinophils Absolute: 0.1 10*3/uL (ref 0.0–0.7)
Eosinophils Relative: 2 % (ref 0–5)
HCT: 43.8 % (ref 39.0–52.0)
HEMOGLOBIN: 15.1 g/dL (ref 13.0–17.0)
Lymphocytes Relative: 32 % (ref 12–46)
Lymphs Abs: 2 10*3/uL (ref 0.7–4.0)
MCH: 30.9 pg (ref 26.0–34.0)
MCHC: 34.5 g/dL (ref 30.0–36.0)
MCV: 89.8 fL (ref 78.0–100.0)
Monocytes Absolute: 0.3 10*3/uL (ref 0.1–1.0)
Monocytes Relative: 4 % (ref 3–12)
NEUTROS PCT: 62 % (ref 43–77)
Neutro Abs: 4 10*3/uL (ref 1.7–7.7)
Platelets: 169 10*3/uL (ref 150–400)
RBC: 4.88 MIL/uL (ref 4.22–5.81)
RDW: 12.1 % (ref 11.5–15.5)
WBC: 6.4 10*3/uL (ref 4.0–10.5)

## 2014-07-06 LAB — COMPREHENSIVE METABOLIC PANEL
ALBUMIN: 4 g/dL (ref 3.5–5.2)
ALT: 32 U/L (ref 0–53)
AST: 28 U/L (ref 0–37)
Alkaline Phosphatase: 67 U/L (ref 39–117)
Anion gap: 6 (ref 5–15)
BUN: 6 mg/dL (ref 6–23)
CHLORIDE: 105 mmol/L (ref 96–112)
CO2: 29 mmol/L (ref 19–32)
Calcium: 9.2 mg/dL (ref 8.4–10.5)
Creatinine, Ser: 1.12 mg/dL (ref 0.50–1.35)
GFR calc Af Amer: >90 mL/min (ref >90)
GFR, EST NON AFRICAN AMERICAN: 84 mL/min — AB (ref >90)
Glucose, Bld: 148 mg/dL — ABNORMAL HIGH (ref 70–99)
POTASSIUM: 3.2 mmol/L — AB (ref 3.5–5.1)
Sodium: 140 mmol/L (ref 135–145)
TOTAL PROTEIN: 6.6 g/dL (ref 6.0–8.3)
Total Bilirubin: 0.6 mg/dL (ref 0.3–1.2)

## 2014-07-06 LAB — CBC
HCT: 47.8 % (ref 40.0–52.0)
HGB: 16.3 g/dL (ref 13.0–18.0)
MCH: 30.8 pg (ref 26.0–34.0)
MCHC: 34.1 g/dL (ref 32.0–36.0)
MCV: 91 fL (ref 80–100)
Platelet: 170 10*3/uL (ref 150–440)
RBC: 5.28 10*6/uL (ref 4.40–5.90)
RDW: 12.4 % (ref 11.5–14.5)
WBC: 6 10*3/uL (ref 3.8–10.6)

## 2014-07-06 LAB — TROPONIN I
Troponin I: <0.03 ng/mL (ref <0.031)
Troponin-I: <0.02 ng/mL
Troponin-I: <0.02 ng/mL

## 2014-07-06 LAB — BASIC METABOLIC PANEL
ANION GAP: 6 — AB (ref 7–16)
BUN: 7 mg/dL (ref 7–18)
CHLORIDE: 107 mmol/L (ref 98–107)
CREATININE: 0.98 mg/dL (ref 0.60–1.30)
Calcium, Total: 8.8 mg/dL (ref 8.5–10.1)
Co2: 26 mmol/L (ref 21–32)
EGFR (Non-African Amer.): >60
GLUCOSE: 98 mg/dL (ref 65–99)
OSMOLALITY: 275 (ref 275–301)
Potassium: 4 mmol/L (ref 3.5–5.1)
Sodium: 139 mmol/L (ref 136–145)

## 2014-07-06 MED ORDER — HEPARIN SODIUM (PORCINE) 5000 UNIT/ML IJ SOLN
5000.0000 [IU] | Freq: Three times a day (TID) | INTRAMUSCULAR | Status: DC
  Administered 2014-07-06 – 2014-07-07 (×2): 5000 [IU] via SUBCUTANEOUS
  Filled 2014-07-06 (×5): qty 1

## 2014-07-06 MED ORDER — ALBUTEROL SULFATE (2.5 MG/3ML) 0.083% IN NEBU
3.0000 mL | INHALATION_SOLUTION | Freq: Two times a day (BID) | RESPIRATORY_TRACT | Status: DC
  Administered 2014-07-07: 3 mL via RESPIRATORY_TRACT
  Filled 2014-07-06: qty 3

## 2014-07-06 MED ORDER — ALBUTEROL SULFATE (2.5 MG/3ML) 0.083% IN NEBU: 2.5000 mg | INHALATION_SOLUTION | RESPIRATORY_TRACT | Status: DC | PRN

## 2014-07-06 MED ORDER — DEXTROSE 5 % IV SOLN
500.0000 mg | INTRAVENOUS | Status: DC
  Administered 2014-07-06: 500 mg via INTRAVENOUS
  Filled 2014-07-06 (×2): qty 500

## 2014-07-06 MED ORDER — SODIUM CHLORIDE 0.9 % IV SOLN
INTRAVENOUS | Status: DC
  Administered 2014-07-06: 20:00:00 via INTRAVENOUS

## 2014-07-06 MED ORDER — ONDANSETRON HCL 4 MG/2ML IJ SOLN: 4.0000 mg | Freq: Four times a day (QID) | INTRAMUSCULAR | Status: DC | PRN

## 2014-07-06 MED ORDER — ADULT MULTIVITAMIN W/MINERALS CH
1.0000 | ORAL_TABLET | Freq: Every day | ORAL | Status: DC
  Administered 2014-07-07: 1 via ORAL
  Filled 2014-07-06: qty 1

## 2014-07-06 MED ORDER — OXYCODONE HCL 5 MG PO TABS
5.0000 mg | ORAL_TABLET | ORAL | Status: DC | PRN
  Administered 2014-07-06 – 2014-07-07 (×5): 5 mg via ORAL
  Filled 2014-07-06 (×4): qty 1

## 2014-07-06 MED ORDER — ALBUTEROL SULFATE (2.5 MG/3ML) 0.083% IN NEBU
3.0000 mL | INHALATION_SOLUTION | RESPIRATORY_TRACT | Status: DC
  Administered 2014-07-06: 3 mL via RESPIRATORY_TRACT
  Filled 2014-07-06: qty 3

## 2014-07-06 MED ORDER — ALUM & MAG HYDROXIDE-SIMETH 200-200-20 MG/5ML PO SUSP: 30.0000 mL | Freq: Four times a day (QID) | ORAL | Status: DC | PRN

## 2014-07-06 MED ORDER — ONDANSETRON HCL 4 MG PO TABS: 4.0000 mg | ORAL_TABLET | Freq: Four times a day (QID) | ORAL | Status: DC | PRN

## 2014-07-06 NOTE — H&P (Signed)
Referring Physician:  MAVRIC [name] is an 34 y.o. male.                       Chief Complaint: Shortness of breath  HPI: 34 year old male with cough, chest pain and shortness of breath x 2 weeks had possible right lung pneumonia on recent chest x-ray. No fever.   Past Medical History  Diagnosis Date  . Hx of colonic polyps   . History of seizure disorder childhood    last seizure at 34 y/o, currently off meds (?febrile, heat induced)  . HTN (hypertension)   . Chronic diarrhea     thought due to IBS, s/p colonoscopies in past  . 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 Retained metal fragment 03/2012    R lat abd wall by CT scan  . Fatty liver 03/2012    by CT, mild HSM  . Diverticulosis 03/2012    left sided by CT  . Chronic pain syndrome 2010    back injury  . Smoker   . HLD (hyperlipidemia) 06/30/2014      Past Surgical History  Procedure Laterality Date  . Appendectomy  2013    2014 repeat surgery for stump appendix (at [place])  . Colonoscopy  06/2012    ext hemm, 1 rectal polyp ([facility]) [place]pt [facility] yrs ([facility])  . Colonoscopy  07/2013    hypertrophied anal papillae, hemm, normal biopsies ([place])  . Esi  2012    effective x1 mo  . Discogram      L2/3, L3/4, L5/S1 HNP with pinched nerves  . Nuclear stress test  02/2014    EF 64%, fixed inferior wall defect thought diaphragm/artifact Doylene Canard)  . Mri  02/2014    mild congenital narrowing of spine as well as mild bulging disc at L5/S1    Family History  Problem Relation Age of Onset  . CAD Father 80    massive MI  . Cancer Other     colon - 4 maternal and paternal aunts and uncles  . Cancer Maternal Grandfather     colon  . Cancer Maternal Aunt     breast  . Hypertension Father   . Stroke Neg Hx   . Diabetes Neg Hx   . GI Disease Other     crohn's and UC   Social History:  reports that he has  been smoking Cigarettes.  He started smoking about 18 years ago. He has never used smokeless tobacco. He reports that he drinks alcohol. He reports that he does not use illicit drugs.  Allergies:  Allergies  Allergen Reactions  . Sertraline Other (See Comments)    Bad thoughts, lethargic, self harm    Medications Prior to Admission  Medication Sig Dispense Refill  . amitriptyline (ELAVIL) 50 MG tablet Take 1 tablet (50 mg total) by mouth at bedtime. 30 tablet 6  . amLODipine (NORVASC) 10 MG tablet Take 1 tablet (10 mg total) by mouth daily. 30 tablet 6  . aspirin 81 MG tablet Take 81 mg by mouth daily.    Marland Kitchen atorvastatin (LIPITOR) 20 MG tablet Take 1 tablet (20 mg total) by mouth daily. (Patient taking differently: Take 20 mg by mouth daily at 6 PM. ) 30 tablet 6  . lisinopril (PRINIVIL,ZESTRIL) 5 MG tablet Take 5 mg by mouth every  other day.     Marland Kitchen HYDROcodone-acetaminophen (NORCO/VICODIN) 5-325 MG per tablet Take 1 tablet by mouth every 8 (eight) hours as needed for moderate pain. 50 tablet 0  . ibuprofen (ADVIL,MOTRIN) 800 MG tablet Take 800 mg by mouth 2 (two) times daily as needed.    . loperamide (IMODIUM) 2 MG capsule Take 2 mg by mouth 2 (two) times daily as needed for diarrhea or loose stools.     Marland Kitchen omeprazole (PRILOSEC) 40 MG capsule Take 1 capsule (40 mg total) by mouth daily. 30 capsule 3    No results found for this or any previous visit (from the past 48 hour(s)). No results found.  Review Of Systems No weight gain or loss. Wears glasses. No eye surgery. No hearing loss, No asthma, No COPD. + dizziness, chest pain. Negative leg edema, claudication. Positive abdominal pain, nausea, diarrhea.. No GI bleed, ulcers, hepatitis or blood transfusion. No kidney stone or hematuria. No stroke, seizures or psychiatric admission, + anxiety No joint pains or skin rash.  Physical Exam: Blood pressure 139/91, pulse 91, temperature 98.4 F (36.9 C), temperature source Oral, resp.  rate 18, height 5\' 11"  (1.803 m), SpO2 99 %. HEENT: [place]/AT, Blue eyes, wears distant glasses, conj-pink, sclera-white, Midline pink tongue. Neck: No JVD, bruit or thyromegaly. Full range of motion. Lungs: Clear, bilateral. Chest wall tender over sternum.Marland Kitchen Heart:-S1, S2 normal. No murmur. Abdomen:-Soft mild epigastric tenderness. Extremities: No edema, cyanosis or clubbing. CNS: Cranial nerves grossly intact. Bilateral equal grips. Skin: Warm and dry.  Assessment/Plan Shortness of breath Possible right lung pneumonia Hypertension Irritable bowel syndrome Tobbacco use disorder  Place in observation/Breathing treatments.  Aaron Riddle, MD  07/06/2014, 8:23 PM

## 2014-07-07 ENCOUNTER — Encounter (HOSPITAL_COMMUNITY): Payer: Self-pay | Admitting: *Deleted

## 2014-07-07 ENCOUNTER — Observation Stay (HOSPITAL_COMMUNITY): Payer: BLUE CROSS/BLUE SHIELD

## 2014-07-07 DIAGNOSIS — R079 Chest pain, unspecified: Secondary | ICD-10-CM | POA: Diagnosis not present

## 2014-07-07 LAB — CBC
HCT: 42.7 % (ref 39.0–52.0)
HEMOGLOBIN: 14.6 g/dL (ref 13.0–17.0)
MCH: 30.7 pg (ref 26.0–34.0)
MCHC: 34.2 g/dL (ref 30.0–36.0)
MCV: 89.7 fL (ref 78.0–100.0)
Platelets: 167 10*3/uL (ref 150–400)
RBC: 4.76 MIL/uL (ref 4.22–5.81)
RDW: 12.1 % (ref 11.5–15.5)
WBC: 4.8 10*3/uL (ref 4.0–10.5)

## 2014-07-07 LAB — PROTIME-INR
INR: 1.06 (ref 0.00–1.49)
PROTHROMBIN TIME: 14 s (ref 11.6–15.2)

## 2014-07-07 LAB — BASIC METABOLIC PANEL
ANION GAP: 7 (ref 5–15)
BUN: 7 mg/dL (ref 6–23)
CHLORIDE: 106 mmol/L (ref 96–112)
CO2: 26 mmol/L (ref 19–32)
CREATININE: 0.96 mg/dL (ref 0.50–1.35)
Calcium: 8.6 mg/dL (ref 8.4–10.5)
GFR calc Af Amer: >90 mL/min (ref >90)
GFR calc non Af Amer: >90 mL/min (ref >90)
Glucose, Bld: 97 mg/dL (ref 70–99)
Potassium: 4.2 mmol/L (ref 3.5–5.1)
Sodium: 139 mmol/L (ref 135–145)

## 2014-07-07 MED ORDER — REGADENOSON 0.4 MG/5ML IV SOLN
0.4000 mg | Freq: Once | INTRAVENOUS | Status: AC
  Administered 2014-07-07: 0.4 mg via INTRAVENOUS
  Filled 2014-07-07: qty 5

## 2014-07-07 MED ORDER — OXYCODONE-ACETAMINOPHEN 5-325 MG PO TABS
ORAL_TABLET | ORAL | Status: AC
  Filled 2014-07-07: qty 1

## 2014-07-07 MED ORDER — POTASSIUM CHLORIDE ER 10 MEQ PO TBCR: 10.0000 meq | EXTENDED_RELEASE_TABLET | Freq: Every day | ORAL | Status: DC

## 2014-07-07 MED ORDER — TECHNETIUM TC 99M SESTAMIBI GENERIC - CARDIOLITE
10.0000 | Freq: Once | INTRAVENOUS | Status: AC | PRN
  Administered 2014-07-07: 10 via INTRAVENOUS

## 2014-07-07 MED ORDER — POTASSIUM CHLORIDE ER 10 MEQ PO TBCR
10.0000 meq | EXTENDED_RELEASE_TABLET | Freq: Three times a day (TID) | ORAL | Status: DC
  Administered 2014-07-07 (×2): 10 meq via ORAL
  Filled 2014-07-07 (×3): qty 1

## 2014-07-07 MED ORDER — TECHNETIUM TC 99M SESTAMIBI GENERIC - CARDIOLITE
30.0000 | Freq: Once | INTRAVENOUS | Status: AC | PRN
  Administered 2014-07-07: 30 via INTRAVENOUS

## 2014-07-07 MED ORDER — REGADENOSON 0.4 MG/5ML IV SOLN
INTRAVENOUS | Status: AC
  Filled 2014-07-07: qty 5

## 2014-07-07 NOTE — Discharge Summary (Signed)
Physician Discharge Summary  Patient ID: Aaron [name] MRN: [id number] DOB/AGE: February 20, 1981 34 y.o.  Admit date: 07/06/2014 Discharge date: 07/07/2014  Admission Diagnoses: Shortness of breath Possible right lung pneumonia Hypertension Irritable bowel syndrome Tobacco use disorder  Discharge Diagnoses:  Principal Problem: *Anxiety*   Shortness of breath due to above   Pneumonia, ruled out   Irritable bowel syndrome   Tobacco use disorder   Hypokalemia  Discharged Condition: good  Hospital Course: 34 year old male with cough, chest pain and shortness of breath x 2 weeks had possible right lung pneumonia on recent chest x-ray. No fever.  His repeat chest x-ray did not show pneumonia. His nuclear stress test was also without reversible ischemia. His hypokalemia was treated with potassium supplement. Patient admitted to increased stress. He was advised to use breathing yoga to relax and use other methods of relaxation as needed and afforded. He will see his primary care in 1 week and will seek psychiatric help in future if needed.   Consults: cardiology  Significant Diagnostic Studies: labs: Normal CBC, CMET- near normal except potassium of 3.2 and glucose of 148-non-fasting.   EKG-NSR and incomplete RBBB.  Chest X-ray: Previous questioned right lower lobe consolidation has resolved. There is no acute pulmonary process.  Nuclear stress test: 1. No reversible ischemia or infarction. 2. Normal left ventricular wall motion. 3. Left ventricular ejection fraction is 55%. 4. Low-risk stress test findings*.  Treatments: antibiotics: azithromycin and nuclear stress test.  Discharge Exam: Blood pressure 117/70, pulse 64, temperature 97.9 F (36.6 C), temperature source Oral, resp. rate 18, height 5\' 11"  (1.803 m), weight 83.9 kg (184 lb 15.5 oz), SpO2 100 %. HEENT: [place]/AT, Blue eyes, wears distant glasses, conj-pink, sclera-white, Midline pink tongue. Neck: No JVD, bruit or  thyromegaly. Full range of motion. Lungs: Clear, bilateral. Chest wall tender over sternum. Heart:-S1, S2 normal. No murmur. Abdomen:-Soft mild epigastric tenderness. Extremities: No edema, cyanosis or clubbing. CNS: Cranial nerves grossly intact. Bilateral equal grips. Skin: Warm and dry.  Disposition: 01-Home or Self Care     Medication List    ASK your doctor about these medications        amitriptyline 50 MG tablet  Commonly known as:  ELAVIL  Take 1 tablet (50 mg total) by mouth at bedtime.     amLODipine 10 MG tablet  Commonly known as:  NORVASC  Take 1 tablet (10 mg total) by mouth daily.     aspirin 81 MG tablet  Take 81 mg by mouth daily.     atorvastatin 20 MG tablet  Commonly known as:  LIPITOR  Take 1 tablet (20 mg total) by mouth daily.     HYDROcodone-acetaminophen 5-325 MG per tablet  Commonly known as:  NORCO/VICODIN  Take 1 tablet by mouth every 8 (eight) hours as needed for moderate pain.     ibuprofen 800 MG tablet  Commonly known as:  ADVIL,MOTRIN  Take 800 mg by mouth 2 (two) times daily as needed.     lisinopril 5 MG tablet  Commonly known as:  PRINIVIL,ZESTRIL  Take 5 mg by mouth every other day.     loperamide 2 MG capsule  Commonly known as:  IMODIUM  Take 2 mg by mouth 2 (two) times daily as needed for diarrhea or loose stools.     omeprazole 40 MG capsule  Commonly known as:  PRILOSEC  Take 1 capsule (40 mg total) by mouth daily.  Follow-up Information    Follow up with Ria Bush, MD. Schedule an appointment as soon as possible for a visit in 1 week.   Specialty:  Family Medicine   Contact information:   [place] [address] [phone]       Signed: Birdie Riddle 07/07/2014, 6:27 PM

## 2014-07-07 NOTE — Progress Notes (Signed)
UR completed 

## 2014-07-07 NOTE — Progress Notes (Signed)
Darden Amber to be D/C'd Home per MD order.  Discussed with the patient and all questions fully answered.    Medication List    ASK your doctor about these medications        amitriptyline 50 MG tablet  Commonly known as:  ELAVIL  Take 1 tablet (50 mg total) by mouth at bedtime.     amLODipine 10 MG tablet  Commonly known as:  NORVASC  Take 1 tablet (10 mg total) by mouth daily.     aspirin 81 MG tablet  Take 81 mg by mouth daily.     atorvastatin 20 MG tablet  Commonly known as:  LIPITOR  Take 1 tablet (20 mg total) by mouth daily.     HYDROcodone-acetaminophen 5-325 MG per tablet  Commonly known as:  NORCO/VICODIN  Take 1 tablet by mouth every 8 (eight) hours as needed for moderate pain.     ibuprofen 800 MG tablet  Commonly known as:  ADVIL,MOTRIN  Take 800 mg by mouth 2 (two) times daily as needed.     lisinopril 5 MG tablet  Commonly known as:  PRINIVIL,ZESTRIL  Take 5 mg by mouth every other day.     loperamide 2 MG capsule  Commonly known as:  IMODIUM  Take 2 mg by mouth 2 (two) times daily as needed for diarrhea or loose stools.     omeprazole 40 MG capsule  Commonly known as:  PRILOSEC  Take 1 capsule (40 mg total) by mouth daily.        VVS, Skin clean, dry and intact without evidence of skin break down, no evidence of skin tears noted. IV catheter discontinued intact. Site without signs and symptoms of complications. Dressing and pressure applied.  An After Visit Summary was printed and given to the patient.  D/c education completed with patient/family including follow up instructions, medication list, d/c activities limitations if indicated, with other d/c instructions as indicated by MD - patient able to verbalize understanding, all questions fully answered.   Patient instructed to return to ED, call 911, or call MD for any changes in condition.   Patient escorted via [place], and D/C home via private auto.  Jaycen Vercher D 07/07/2014 6:28 PM

## 2014-07-08 ENCOUNTER — Telehealth: Payer: Self-pay | Admitting: Family Medicine

## 2014-07-08 MED ORDER — BENZONATATE 100 MG PO CAPS: 100.0000 mg | ORAL_CAPSULE | Freq: Two times a day (BID) | ORAL | Status: DC | PRN

## 2014-07-08 NOTE — Telephone Encounter (Signed)
Message left for patient to return my call.  

## 2014-07-08 NOTE — Telephone Encounter (Signed)
Patient returned your call.  Please call him back at [phone].

## 2014-07-08 NOTE — Telephone Encounter (Signed)
Ok to do [employer]. Will see next week.

## 2014-07-08 NOTE — Telephone Encounter (Signed)
Spoke with patient and follow up scheduled. He was asking for something for cough. I advised to check with [facility] later for possible Tessalon Perles. He verbalized understanding.

## 2014-07-08 NOTE — Telephone Encounter (Signed)
Pt admitted 2/3-09/2014 for dyspnea. PNA was ruled out, stress test normal. Attributed to anxiety plz call for hosp f/u phone call and schedule f/u appt in 1-2 wks

## 2014-07-14 NOTE — Telephone Encounter (Signed)
Opened in error

## 2014-07-15 ENCOUNTER — Ambulatory Visit (INDEPENDENT_AMBULATORY_CARE_PROVIDER_SITE_OTHER): Payer: BLUE CROSS/BLUE SHIELD | Admitting: Family Medicine

## 2014-07-15 ENCOUNTER — Encounter: Payer: Self-pay | Admitting: Family Medicine

## 2014-07-15 VITALS — BP 142/86 | HR 104 | Temp 97.6°F | Wt 188.0 lb

## 2014-07-15 DIAGNOSIS — F419 Anxiety disorder, unspecified: Secondary | ICD-10-CM

## 2014-07-15 DIAGNOSIS — R072 Precordial pain: Secondary | ICD-10-CM

## 2014-07-15 DIAGNOSIS — F17200 Nicotine dependence, unspecified, uncomplicated: Secondary | ICD-10-CM

## 2014-07-15 DIAGNOSIS — Z720 Tobacco use: Secondary | ICD-10-CM

## 2014-07-15 DIAGNOSIS — I10 Essential (primary) hypertension: Secondary | ICD-10-CM

## 2014-07-15 DIAGNOSIS — G8929 Other chronic pain: Secondary | ICD-10-CM

## 2014-07-15 DIAGNOSIS — M5410 Radiculopathy, site unspecified: Secondary | ICD-10-CM

## 2014-07-15 DIAGNOSIS — M5416 Radiculopathy, lumbar region: Secondary | ICD-10-CM

## 2014-07-15 MED ORDER — HYDROCODONE-ACETAMINOPHEN 5-325 MG PO TABS: 1.0000 | ORAL_TABLET | Freq: Three times a day (TID) | ORAL | Status: DC | PRN

## 2014-07-15 MED ORDER — METOPROLOL SUCCINATE ER 25 MG PO TB24: 25.0000 mg | ORAL_TABLET | Freq: Every day | ORAL | Status: DC

## 2014-07-15 MED ORDER — AMITRIPTYLINE HCL 100 MG PO TABS: 100.0000 mg | ORAL_TABLET | Freq: Every day | ORAL | Status: AC

## 2014-07-15 NOTE — Assessment & Plan Note (Signed)
Encourage continued smoking cessation attempts.

## 2014-07-15 NOTE — Assessment & Plan Note (Addendum)
Chronic, overall stable. Continue regimen of amlodipine 10mg  daily. Stop lisinopril, start toprol XL 25mg  daily given mild tachycardia noted today and concerns with persistent anxiety. Return 2 mo f/u visit.

## 2014-07-15 NOTE — Assessment & Plan Note (Signed)
Advised not surgical candidate, advised re consider ESI. Will continue to monitor, hydrocodone refilled.

## 2014-07-15 NOTE — Patient Instructions (Addendum)
For blood pressure and heart rate, stop lisinopril. Start toprol XL 25mg  daily (metoprolol succinate).  Increase amitriptyline to 100mg  nightly.  Return as needed or in 2 months for follow up.

## 2014-07-15 NOTE — Progress Notes (Signed)
BP 142/86 mmHg  Pulse 104  Temp(Src) 97.6 F (36.4 C) (Oral)  Wt 188 lb (85.276 kg)  SpO2 99%   CC: hosp f/u visit  Subjective:    Patient ID: Aaron [name], male    DOB: [birth year], 34 y.o.   MRN: [id number]  HPI: Aaron [name] is a 34 y.o. male presenting on 07/15/2014 for Follow-up   Recent chest pain s/p hospitalization by cardiology Dr Doylene Canard with ?PNA, but rpt xray ruled out PNA. Also had normal nuclear stress test without reversible ischemia and EF 55%. EKG showed NSR with incomplete RBBB. Since discharge, chest pain still present. Advised likely anxiety. Denies palpitations.   Recently saw NSG, advised not surgical candidate. No further treatment/management recommended.   Smoking - down to a few cig/day.  MRI Date: 02/2014 mild congenital narrowing of spine as well as mild bulging disc at L5/S1  Admit date: 07/06/2014 Discharge date: 07/07/2014 F/u phone call: 07/08/2014  Admission Diagnoses: Shortness of breath Possible right lung pneumonia Hypertension Irritable bowel syndrome Tobacco use disorder  Discharge Diagnoses:  Principal Problem: *Anxiety*  Shortness of breath due to above  Pneumonia, ruled out  Irritable bowel syndrome  Tobacco use disorder  Hypokalemia  Relevant past medical, surgical, family and social history reviewed and updated as indicated. Interim medical history since our last visit reviewed. Allergies and medications reviewed and updated. Current Outpatient Prescriptions on File Prior to Visit  Medication Sig  . amLODipine (NORVASC) 10 MG tablet Take 1 tablet (10 mg total) by mouth daily.  Marland Kitchen aspirin 81 MG tablet Take 81 mg by mouth daily.  Marland Kitchen atorvastatin (LIPITOR) 20 MG tablet Take 1 tablet (20 mg total) by mouth daily.  Marland Kitchen ibuprofen (ADVIL,MOTRIN) 800 MG tablet Take 800 mg by mouth 2 (two) times daily as needed.  . loperamide (IMODIUM) 2 MG capsule Take 2 mg by mouth 2 (two) times daily as needed for diarrhea or loose stools.   Marland Kitchen  omeprazole (PRILOSEC) 40 MG capsule Take 1 capsule (40 mg total) by mouth daily.   No current facility-administered medications on file prior to visit.    Review of Systems Per HPI unless specifically indicated above     Objective:    BP 142/86 mmHg  Pulse 104  Temp(Src) 97.6 F (36.4 C) (Oral)  Wt 188 lb (85.276 kg)  SpO2 99%  Wt Readings from Last 3 Encounters:  07/15/14 188 lb (85.276 kg)  07/07/14 184 lb 15.5 oz (83.9 kg)  06/30/14 185 lb 8 oz (84.142 kg)    Physical Exam  Constitutional: He appears well-developed and well-nourished. No distress.  HENT:  Head: Normocephalic and atraumatic.  Mouth/Throat: Oropharynx is clear and moist.  Cardiovascular: Normal rate, regular rhythm, normal heart sounds and intact distal pulses.   No murmur heard. Pulmonary/Chest: Effort normal and breath sounds normal. No respiratory distress. He has no wheezes. He has no rales.  Musculoskeletal: Normal range of motion. He exhibits no edema.  Skin: Skin is warm and dry. No rash noted.  Psychiatric: His mood appears anxious.  Nursing note and vitals reviewed.      Assessment & Plan:   Problem List Items Addressed This Visit    Smoker    Encourage continued smoking cessation attempts.      HTN (hypertension) - Primary    Chronic, overall stable. Continue regimen of amlodipine 10mg  daily. Stop lisinopril, start toprol XL 25mg  daily given mild tachycardia noted today and concerns with persistent anxiety. Return 2 mo f/u visit.  Relevant Medications   metoprolol succinate (TOPROL-XL) 24 hr tablet   Chronic radicular pain of lower back    Advised not surgical candidate, advised re consider ESI. Will continue to monitor, hydrocodone refilled.      Relevant Medications   amitriptyline (ELAVIL) tablet   Chest pain    Recent cardiac workup negative. Advised likely anxiety/stress related.      Anxiety disorder    Increase amitriptyline to 100mg  nightly. Monitor for  improvement on anxiety on higher dose. F/u 2 mo          Follow up plan: Return in about 2 months (around 09/13/2014), or as needed, for follow up visit.

## 2014-07-15 NOTE — Assessment & Plan Note (Signed)
Recent cardiac workup negative. Advised likely anxiety/stress related.

## 2014-07-15 NOTE — Assessment & Plan Note (Signed)
Increase amitriptyline to 100mg  nightly. Monitor for improvement on anxiety on higher dose. F/u 2 mo

## 2014-07-15 NOTE — Progress Notes (Signed)
Pre visit review using our clinic review tool, if applicable. No additional management support is needed unless otherwise documented below in the visit note. 

## 2014-08-11 ENCOUNTER — Other Ambulatory Visit: Payer: Self-pay

## 2014-08-11 MED ORDER — HYDROCODONE-ACETAMINOPHEN 5-325 MG PO TABS: 1.0000 | ORAL_TABLET | Freq: Three times a day (TID) | ORAL | Status: DC | PRN

## 2014-08-11 NOTE — Telephone Encounter (Signed)
Kim left v/m requesting rx hydrocodone apap. Call when ready for pick up. Pt last seen 07/15/14.

## 2014-08-11 NOTE — Telephone Encounter (Signed)
Printed and in Kim's box 

## 2014-08-12 NOTE — Telephone Encounter (Signed)
Message left notifying patient and Rx placed up front for pick up. 

## 2014-09-02 DIAGNOSIS — R892 Abnormal level of other drugs, medicaments and biological substances in specimens from other organs, systems and tissues: Secondary | ICD-10-CM

## 2014-09-02 HISTORY — DX: Abnormal level of other drugs, medicaments and biological substances in specimens from other organs, systems and tissues: R89.2

## 2014-09-12 ENCOUNTER — Other Ambulatory Visit: Payer: Self-pay

## 2014-09-12 MED ORDER — HYDROCODONE-ACETAMINOPHEN 5-325 MG PO TABS: 1.0000 | ORAL_TABLET | Freq: Three times a day (TID) | ORAL | Status: DC | PRN

## 2014-09-12 NOTE — Telephone Encounter (Signed)
Pt left v/m requesting rx hydrocodone apap. Call when ready for pick up.pt last seen 07/15/14.

## 2014-09-12 NOTE — Telephone Encounter (Signed)
Patient's wife notified and Rx placed up front for pick up. 

## 2014-09-12 NOTE — Telephone Encounter (Signed)
plz phone in. 

## 2014-09-24 NOTE — Consult Note (Signed)
Brief Consult Note: Diagnosis: Adjustment disorder with depressed mood, alcohol abuse.   Patient was seen by consultant.   Consult note dictated.   Recommend further assessment or treatment.   Orders entered.   Comments: Patient was seen by consultant.   Consult note dictated.   Recommend further assessment or treatment.   Comments: Aaron [name] has a h/o depression and excessive deinking. He hit his face on bathroom counter whiule drunk and upset with lost custody of his daughter.  PLAN: 1. The patient no longer meets criteria for IVC. I will terminate proceedings. Please discharge as appropriate.  2. He is to continue therapy and Zoloft for depression.   3. Insomnia-will prescribe Trazodone. Rx given.   4. Alcohol abuse-He minimizes drinking and declines substance abuse treatment.   5. He will follow up with his therapist in the community. He has Multimedia programmer.  6. He was counseled abut dangers to combine alcohol and xanax.  7. His wife will pick him up.  Electronic Signatures: Orson Slick (MD)  (Signed 22-Jun-15 13:02)  Authored: Brief Consult Note   Last Updated: 22-Jun-15 13:02 by Orson Slick (MD)

## 2014-09-24 NOTE — Consult Note (Signed)
PATIENT NAME:  Aaron [name], ARMENT MR#:  [id number] DATE OF BIRTH:  [birth year]  DATE OF CONSULTATION:  11/22/2013  REFERRING PHYSICIAN:  Yetta Numbers. Karma Greaser, MD CONSULTING PHYSICIAN:  Jolanta B. Pucilowska, MD  REASON FOR CONSULTATION: To evaluate a suicidal patient.   IDENTIFYING DATA: Mr. Aaron [name] is a 34 year old male with no past psychiatric history.   CHIEF COMPLAINT: "I was drunk."   HISTORY OF PRESENT ILLNESS: Aaron [name] is married and has a 69-year-old daughter. From his previous marriage, he has an 34 year old adopted daughter. Apparently, his ex-wife made it so that the patient was unaware that he was renounced his interested in adopting this 11 year old child. He now has no parental rights and the case is over. He cannot do anything about it. He learned about it 2 months ago. He been doing fine up until recently, possibly on Father's Day.  He got a little upset and had a case of beer and while drunk started banging his head on the bathroom counter. His wife called police and he was brought to the Emergency Room. The patient adamantly denies any suicidal thoughts, intentions or plan. He is not a drinker and understands that getting drunk grossly contributes to his problems. He denies any symptoms of depression, anxiety or psychosis. There are no symptoms suggestive of bipolar mania. He denies alcohol or illicit substance use. He drinks infrequently. However, on further investigation, we learned that the patient must have been slightly depressed because he started seeing a therapist at the [facility]. He saw Coralyn Mark, his therapist, just once. Also, a month ago he was started on Zoloft but stopped taking it several days ago and did not feel it was helpful. Apparently, his provider gave him also prescription for the Xanax and that the patient finds helpful. He was counseled on dangers of combining alcohol and benzodiazepines and he seemed to understand. Indeed, he was positive for benzodiazepine on  admission while his blood alcohol level was 0.168.   PAST PSYCHIATRIC HISTORY: Nothing up until recently. Apparently, loss of parental rights to his 19 year old child has been more depressing than he appreciated. There were no suicide attempts. No hospitalizations and no other medication trial except for Zoloft. He denies daily drinking.   FAMILY PSYCHIATRIC HISTORY: None reported.   PAST MEDICAL HISTORY: None.   ALLERGIES: No known drug allergies.   MEDICATIONS ON ADMISSION: Trazodone and Xanax unknown dose.   SOCIAL HISTORY: He is married for the second time, lives with his wife and a 8-year-old daughter.  He has a Scientist, water quality, is fully employed.   REVIEW OF SYSTEMS:    CONSTITUTIONAL: No fevers or chills. No weight changes.  EYES: No double or blurred vision.  ENT: No hearing loss.  RESPIRATORY: No shortness of breath or cough.  CARDIOVASCULAR: No chest pain or orthopnea.  GASTROINTESTINAL: No abdominal pain, nausea, vomiting or diarrhea.  GENITOURINARY: No incontinence or frequency.  ENDOCRINE: No heat or cold intolerance.  LYMPHATIC: No anemia or easy bruising.  INTEGUMENTARY: No acne or rash.  MUSCULOSKELETAL: No muscle or joint pain.  NEUROLOGIC: No tingling or weakness.  PSYCHIATRIC: See history of present illness for details.   PHYSICAL EXAMINATION: VITAL SIGNS: Blood pressure 104/66, pulse 49, respirations 19, temperature 97.4.  GENERAL: This is a well-developed, middle-aged male in no acute distress.  The rest of the physical examination is deferred to his primary attending.   LABORATORY, DIAGNOSTIC AND RADIOLOGICAL DATA:  Chemistries are within normal limits except for potassium 3.4. Blood alcohol level  is 0.168. LFTs within normal limits except for AST of 39. Urine tox screen is positive for benzodiazepines. CBC within normal limits. Urinalysis is not suggestive of urinary tract infection. Serum acetaminophen and salicylates are low. EKG sinus tachycardia, incomplete  right bundle branch block, borderline EKG.   MENTAL STATUS EXAMINATION: The patient is alert and oriented to person, place, time and situation. He is pleasant, polite and cooperative. He is cool and collected. He is well groomed and wearing hospital scrubs and a yellow shirt. He maintains good eye contact. His speech is of normal rhythm, rate and volume. Mood is fine with full affect. Thought process is logical and goal oriented. Thought content: He denies thoughts of hurting himself or others. There are no delusions or paranoia. There are no auditory or visual hallucinations. His cognition is grossly intact. His registration, recall, short- and long-term memory are intact. He is of above average intelligence and fund of knowledge. His insight and judgment are fair.  DIAGNOSES: AXIS I: Adjustment disorder with depressed mood, alcohol abuse.  AXIS II: Deferred.  AXIS III: Deferred.  AXIS IV: Family conflict.  AXIS V: Global assessment of functioning 60.  PLAN:   1.  The patient no longer meets criteria for involuntary inpatient psychiatric commitment. I will terminate proceedings. Please discharge as appropriate.  2.  The patient is ambivalent about taking Zoloft, stopped it already, but will consider going back to it after consulting with his therapist.  3.  Insomnia. Will prescribe trazodone, prescription was given.  4.  Alcohol abuse. He minimizes drinking and declines substance abuse treatment.  5.  He will follow up with his therapist in the community. He has Multimedia programmer.  6.  He was counseled about dangers of combining alcohol and Xanax.  7.  His wife will pick him up.    ____________________________ Wardell Honour. Bary Leriche, MD jbp:cs D: 11/23/2013 13:53:19 ET T: 11/23/2013 15:25:19 ET JOB#: [id number]  cc: Jolanta B. Bary Leriche, MD, <Dictator> Clovis Fredrickson MD ELECTRONICALLY SIGNED 12/15/2013 4:47

## 2014-09-27 ENCOUNTER — Ambulatory Visit (INDEPENDENT_AMBULATORY_CARE_PROVIDER_SITE_OTHER): Payer: BLUE CROSS/BLUE SHIELD | Admitting: Family Medicine

## 2014-09-27 ENCOUNTER — Encounter: Payer: Self-pay | Admitting: Family Medicine

## 2014-09-27 ENCOUNTER — Encounter: Payer: Self-pay | Admitting: Radiology

## 2014-09-27 VITALS — BP 128/80 | HR 88 | Temp 98.2°F | Wt 192.8 lb

## 2014-09-27 DIAGNOSIS — F17200 Nicotine dependence, unspecified, uncomplicated: Secondary | ICD-10-CM

## 2014-09-27 DIAGNOSIS — G894 Chronic pain syndrome: Secondary | ICD-10-CM

## 2014-09-27 DIAGNOSIS — I10 Essential (primary) hypertension: Secondary | ICD-10-CM

## 2014-09-27 DIAGNOSIS — Z720 Tobacco use: Secondary | ICD-10-CM | POA: Diagnosis not present

## 2014-09-27 DIAGNOSIS — F419 Anxiety disorder, unspecified: Secondary | ICD-10-CM | POA: Diagnosis not present

## 2014-09-27 NOTE — Assessment & Plan Note (Signed)
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 Advised to notify us if desires to try PRN med - would try hydroxyzine. Increased amitriptyline dose helping him sleep better.

## 2014-09-27 NOTE — Patient Instructions (Addendum)
Urine check today. No changes to meds today. Let me know if you'd like to try as needed medicine for anxiety. Hopefully good news tomorrow. Good to see you today, call us with questions. Return as needed or in 6 months for physical

## 2014-09-27 NOTE — Progress Notes (Signed)
Pre visit review using our clinic review tool, if applicable. No additional management support is needed unless otherwise documented below in the visit note. 

## 2014-09-27 NOTE — Assessment & Plan Note (Signed)
UDS today. Minimizing hydrocodone.

## 2014-09-27 NOTE — Progress Notes (Signed)
BP 128/80 mmHg  Pulse 88  Temp(Src) 98.2 F (36.8 C) (Oral)  Wt 192 lb 12.8 oz (87.454 kg)   CC: 2 mo f/u visit  Subjective:    Patient ID: Aaron [name], male    DOB: [birth year], 34 y.o.   MRN: [id number]  HPI: DAELYN MOZER is a 34 y.o. male presenting on 09/27/2014 for Follow-up   Anxiety - last visit we increased amitriptyline to 100mg  daily. He is sleeping better at night time. "Nervous wreck for the last month". 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 Wants to get back into skating - which relaxes him.  HTN - last visit we stopped lisinopril and started toprol XL 25mg  daily and continued amlodipine 10mg  daily. At home bp running 120/80s at night time. No HA, vision changes, CP/tightness, SOB, leg swelling.  Chronic pain from back - limiting narcotics, trying to come off meds. Taking 1 pill every few days. Also taking ibuprofen every few days as well.   Smoking - down to a few cig/day. Wants to recruit wife to quit with him.   Relevant past medical, surgical, family and social history reviewed and updated as indicated. Interim medical history since our last visit reviewed. Allergies and medications reviewed and updated. Current Outpatient Prescriptions on File Prior to Visit  Medication Sig  . amitriptyline (ELAVIL) 100 MG tablet Take 1 tablet (100 mg total) by mouth at bedtime.  Marland Kitchen amLODipine (NORVASC) 10 MG tablet Take 1 tablet (10 mg total) by mouth daily.  Marland Kitchen aspirin 81 MG tablet Take 81 mg by mouth daily.  Marland Kitchen atorvastatin (LIPITOR) 20 MG tablet Take 1 tablet (20 mg total) by mouth daily.  Marland Kitchen HYDROcodone-acetaminophen (NORCO/VICODIN) 5-325 MG per tablet Take 1 tablet by mouth every 8 (eight) hours as needed for moderate pain.  Marland Kitchen ibuprofen (ADVIL,MOTRIN) 800 MG tablet Take 800 mg by mouth 2 (two) times daily as needed.  . loperamide (IMODIUM) 2 MG capsule Take 2 mg by mouth 2 (two) times daily as needed for diarrhea or loose stools.   . metoprolol succinate (TOPROL-XL) 25 MG 24 hr tablet  Take 1 tablet (25 mg total) by mouth daily.  Marland Kitchen omeprazole (PRILOSEC) 40 MG capsule Take 1 capsule (40 mg total) by mouth daily.   No current facility-administered medications on file prior to visit.    Review of Systems Per HPI unless specifically indicated above     Objective:    BP 128/80 mmHg  Pulse 88  Temp(Src) 98.2 F (36.8 C) (Oral)  Wt 192 lb 12.8 oz (87.454 kg)  Wt Readings from Last 3 Encounters:  09/27/14 192 lb 12.8 oz (87.454 kg)  07/15/14 188 lb (85.276 kg)  07/07/14 184 lb 15.5 oz (83.9 kg)    Physical Exam  Constitutional: He appears well-developed and well-nourished. No distress.  HENT:  Mouth/Throat: Oropharynx is clear and moist. No oropharyngeal exudate.  Eyes: Conjunctivae and EOM are normal. Pupils are equal, round, and reactive to light. No scleral icterus.  Neck: Normal range of motion. Neck supple.  Cardiovascular: Normal rate, regular rhythm, normal heart sounds and intact distal pulses.   No murmur heard. Pulmonary/Chest: Effort normal and breath sounds normal. No respiratory distress. He has no wheezes. He has no rales.  Coarse breath sounds  Musculoskeletal: He exhibits no edema.  Skin: Skin is warm and dry. No rash noted.  Psychiatric: His speech is normal and behavior is normal. His mood appears anxious.  Restless, fidgety  Nursing note and vitals reviewed.  Assessment & Plan:   Problem List Items Addressed This Visit    Smoker    Continue to encourage cessation. Action phase. Down to a few cigarettes a day      HTN (hypertension)    Chronic, stable. Continue regimen.       Chronic pain syndrome    UDS today. Minimizing hydrocodone.      Anxiety disorder - Primary    Increased stress at work - ?factory shutting down. Advised to notify us if desires to try PRN med - would try hydroxyzine. Increased amitriptyline dose helping him sleep better.          Follow up plan: Return in about 6 months (around 03/29/2015), or if  symptoms worsen or fail to improve, for annual exam, prior fasting for blood work.

## 2014-09-27 NOTE — Assessment & Plan Note (Signed)
Chronic, stable. Continue regimen. 

## 2014-09-27 NOTE — Assessment & Plan Note (Signed)
Continue to encourage cessation. Action phase. Down to a few cigarettes a day

## 2014-10-08 ENCOUNTER — Encounter: Payer: Self-pay | Admitting: Family Medicine

## 2014-10-17 ENCOUNTER — Encounter: Payer: Self-pay | Admitting: Family Medicine

## 2014-10-17 ENCOUNTER — Ambulatory Visit (INDEPENDENT_AMBULATORY_CARE_PROVIDER_SITE_OTHER): Payer: BLUE CROSS/BLUE SHIELD | Admitting: Family Medicine

## 2014-10-17 ENCOUNTER — Ambulatory Visit (INDEPENDENT_AMBULATORY_CARE_PROVIDER_SITE_OTHER)
Admission: RE | Admit: 2014-10-17 | Discharge: 2014-10-17 | Disposition: A | Discharge status: Home / Self Care | Payer: BLUE CROSS/BLUE SHIELD | Source: Ambulatory Visit | Attending: Family Medicine | Admitting: Family Medicine

## 2014-10-17 VITALS — BP 158/90 | HR 118 | Temp 98.2°F | Ht 71.0 in | Wt 191.0 lb

## 2014-10-17 DIAGNOSIS — R0781 Pleurodynia: Secondary | ICD-10-CM

## 2014-10-17 DIAGNOSIS — I10 Essential (primary) hypertension: Secondary | ICD-10-CM | POA: Diagnosis not present

## 2014-10-17 MED ORDER — HYDROCODONE-ACETAMINOPHEN 5-325 MG PO TABS: 1.0000 | ORAL_TABLET | Freq: Three times a day (TID) | ORAL | Status: DC | PRN

## 2014-10-17 NOTE — Patient Instructions (Addendum)
Xray today of left ribcage I'm suspicious for rib fracture - we will await radiology evaluation and call you if any change in planned management. For now, treat pain with ibuprofen and use hydrocodone as needed for breakthrough pain. This may take 6-8 wks to fully improve but we should see improvement each week.  Rib Fracture A rib fracture is a break or crack in one of the bones of the ribs. The ribs are a group of long, curved bones that wrap around your chest and attach to your spine. They protect your lungs and other organs in the chest cavity. A broken or cracked rib is often painful, but most do not cause other problems. Most rib fractures heal on their own over time. However, rib fractures can be more serious if multiple ribs are broken or if broken ribs move out of place and push against other structures. CAUSES   A direct blow to the chest. For example, this could happen during contact sports, a car accident, or a fall against a hard object.  Repetitive movements with high force, such as pitching a baseball or having severe coughing spells. SYMPTOMS   Pain when you breathe in or cough.  Pain when someone presses on the injured area. DIAGNOSIS  Your caregiver will perform a physical exam. Various imaging tests may be ordered to confirm the diagnosis and to look for related injuries. These tests may include a chest X-ray, computed tomography (CT), magnetic resonance imaging (MRI), or a bone scan. TREATMENT  Rib fractures usually heal on their own in 1-3 months. The longer healing period is often associated with a continued cough or other aggravating activities. During the healing period, pain control is very important. Medication is usually given to control pain. Hospitalization or surgery may be needed for more severe injuries, such as those in which multiple ribs are broken or the ribs have moved out of place.  HOME CARE INSTRUCTIONS   Avoid strenuous activity and any activities or  movements that cause pain. Be careful during activities and avoid bumping the injured rib.  Gradually increase activity as directed by your caregiver.  Only take over-the-counter or prescription medications as directed by your caregiver. Do not take other medications without asking your caregiver first.  Apply ice to the injured area for the first 1-2 days after you have been treated or as directed by your caregiver. Applying ice helps to reduce inflammation and pain.  Put ice in a plastic bag.  Place a towel between your skin and the bag.   Leave the ice on for 15-20 minutes at a time, every 2 hours while you are awake.  Perform deep breathing as directed by your caregiver. This will help prevent pneumonia, which is a common complication of a broken rib. Your caregiver may instruct you to:  Take deep breaths several times a day.  Try to cough several times a day, holding a pillow against the injured area.  Use a device called an incentive spirometer to practice deep breathing several times a day.  Drink enough fluids to keep your urine clear or pale yellow. This will help you avoid constipation.   Do not wear a rib belt or binder. These restrict breathing, which can lead to pneumonia.  SEEK IMMEDIATE MEDICAL CARE IF:   You have a fever.   You have difficulty breathing or shortness of breath.   You develop a continual cough, or you cough up thick or bloody sputum.  You feel sick to your  stomach (nausea), throw up (vomit), or have abdominal pain.   You have worsening pain not controlled with medications.  MAKE SURE YOU:  Understand these instructions.  Will watch your condition.  Will get help right away if you are not doing well or get worse. Document Released: 05/20/2005 Document Revised: 01/20/2013 Document Reviewed: 07/22/2012 [facility] Patient Information 2015 [place], [place]. This information is not intended to replace advice given to you by your health care  provider. Make sure you discuss any questions you have with your health care provider.

## 2014-10-17 NOTE — Assessment & Plan Note (Signed)
bp elevated today - anticipate due to pain. Pt will monitor bp at home and notify us if persistently >140/90 despite better pain control.

## 2014-10-17 NOTE — Progress Notes (Addendum)
BP 158/90 mmHg  Pulse 118  Temp(Src) 98.2 F (36.8 C) (Oral)  Ht 5\' 11"  (1.803 m)  Wt 191 lb (86.637 kg)  BMI 26.65 kg/m2  SpO2 98%   CC: fall with rib pain  Subjective:    Patient ID: Aaron [name], male    DOB: [birth year], 34 y.o.   MRN: [id number]  HPI: Aaron [name] is a 34 y.o. male presenting on 10/17/2014 for [place] over weekend fishing. While crawling out of river, slipped and fell onto left ribcage - painful ever since. Taking motrin for this also tried hot shower. Has not taken motrin for this. Pain worse now than at onset of fall.  Pain with deep breathing, some dyspnea with climbing flight of stairs.   BP Readings from Last 3 Encounters:  10/17/14 158/90  09/27/14 128/80  07/15/14 142/86    Relevant past medical, surgical, family and social history reviewed and updated as indicated. Interim medical history since our last visit reviewed. Allergies and medications reviewed and updated. Current Outpatient Prescriptions on File Prior to Visit  Medication Sig  . amitriptyline (ELAVIL) 100 MG tablet Take 1 tablet (100 mg total) by mouth at bedtime.  Marland Kitchen amLODipine (NORVASC) 10 MG tablet Take 1 tablet (10 mg total) by mouth daily.  Marland Kitchen aspirin 81 MG tablet Take 81 mg by mouth daily.  Marland Kitchen atorvastatin (LIPITOR) 20 MG tablet Take 1 tablet (20 mg total) by mouth daily.  Marland Kitchen ibuprofen (ADVIL,MOTRIN) 800 MG tablet Take 800 mg by mouth 2 (two) times daily as needed.  . loperamide (IMODIUM) 2 MG capsule Take 2 mg by mouth 2 (two) times daily as needed for diarrhea or loose stools.   . metoprolol succinate (TOPROL-XL) 25 MG 24 hr tablet Take 1 tablet (25 mg total) by mouth daily.  Marland Kitchen omeprazole (PRILOSEC) 40 MG capsule Take 1 capsule (40 mg total) by mouth daily.   No current facility-administered medications on file prior to visit.    Review of Systems Per HPI unless specifically indicated above     Objective:    BP 158/90 mmHg  Pulse 118  Temp(Src) 98.2 F  (36.8 C) (Oral)  Ht 5\' 11"  (1.803 m)  Wt 191 lb (86.637 kg)  BMI 26.65 kg/m2  SpO2 98%  Wt Readings from Last 3 Encounters:  10/17/14 191 lb (86.637 kg)  09/27/14 192 lb 12.8 oz (87.454 kg)  07/15/14 188 lb (85.276 kg)    Physical Exam  Constitutional: He appears well-developed and well-nourished.  Cardiovascular: Regular rhythm, normal heart sounds and intact distal pulses.  Tachycardia present.   No murmur heard. Pulmonary/Chest: Effort normal and breath sounds normal. No respiratory distress. He has no decreased breath sounds. He has no wheezes. He has no rhonchi. He has no rales. He exhibits tenderness.    Abrasion L chest wall with tenderness to palpation entire anterior left ribcage and mildly tender laterally No paradoxical movement of chest wall with inspiration  Skin: Skin is warm and dry. No rash noted.  Psychiatric: He has a normal mood and affect.  Nursing note and vitals reviewed.      Assessment & Plan:   Problem List Items Addressed This Visit    HTN (hypertension)    bp elevated today - anticipate due to pain. Pt will monitor bp at home and notify us if persistently >140/90 despite better pain control.      Rib pain on left side - Primary    Anticipate L rib  bruise if not fracture - check L unilateral rib xrays. Suspicious for lower anterior rib fracture - will await evaluation by radiology. In interim, continue ibuprofen, treat breakthrough pain with hydrocodone, re hugging pillow when coughing /sneezing or at home. Encouraged deep breathing to avoid atx. Discussed anticipated 6-8 wks to fully resolve. F/u prn. Lungs clear today. Refilled hydrocodone x1 - pt weaning off this.      Relevant Orders   DG Ribs Unilateral Left       Follow up plan: Return if symptoms worsen or fail to improve.

## 2014-10-17 NOTE — Addendum Note (Signed)
Addended by: Ria Bush on: 10/17/2014 11:28 AM   Modules accepted: Orders

## 2014-10-17 NOTE — Progress Notes (Signed)
Pre visit review using our clinic review tool, if applicable. No additional management support is needed unless otherwise documented below in the visit note. 

## 2014-10-17 NOTE — Assessment & Plan Note (Addendum)
Anticipate L rib bruise if not fracture - check L unilateral rib xrays. Suspicious for lower anterior rib fracture - will await evaluation by radiology. In interim, continue ibuprofen, treat breakthrough pain with hydrocodone, re hugging pillow when coughing /sneezing or at home. Encouraged deep breathing to avoid atx. Discussed anticipated 6-8 wks to fully resolve. F/u prn. Lungs clear today. Refilled hydrocodone x1 - pt weaning off this.

## 2014-10-18 ENCOUNTER — Telehealth: Payer: Self-pay | Admitting: Family Medicine

## 2014-10-18 NOTE — Telephone Encounter (Signed)
Already spoke with wife / already a phone note opened .Marland Kitchen

## 2014-10-18 NOTE — Telephone Encounter (Signed)
Pt wife returned your call. Please call back at [phone]. Thanks.

## 2014-10-18 NOTE — Telephone Encounter (Signed)
Pt wife returned call about xray result

## 2014-10-18 NOTE — Telephone Encounter (Signed)
Wife returned call regarding xrays. Please cal back at [phone]. Thanks.

## 2014-10-18 NOTE — Telephone Encounter (Signed)
Pt currently working/ job . Pt's spouse notified // states that pt is having trouble sleeping . Requesting RX for sleep or any recommended RX OTC?

## 2014-10-19 NOTE — Telephone Encounter (Signed)
Message left advising patient.  

## 2014-10-19 NOTE — Telephone Encounter (Signed)
Would suggest benadryl 25mg  as needed or unisom.

## 2014-10-28 ENCOUNTER — Encounter: Payer: Self-pay | Admitting: Family Medicine

## 2014-11-12 ENCOUNTER — Other Ambulatory Visit: Payer: Self-pay | Admitting: Family Medicine

## 2014-12-08 ENCOUNTER — Telehealth: Payer: Self-pay | Admitting: Family Medicine

## 2014-12-08 DIAGNOSIS — G8929 Other chronic pain: Secondary | ICD-10-CM

## 2014-12-08 DIAGNOSIS — G894 Chronic pain syndrome: Secondary | ICD-10-CM

## 2014-12-08 DIAGNOSIS — M5416 Radiculopathy, lumbar region: Principal | ICD-10-CM

## 2014-12-08 MED ORDER — TRAMADOL HCL 50 MG PO TABS: 50.0000 mg | ORAL_TABLET | Freq: Two times a day (BID) | ORAL | Status: DC | PRN

## 2014-12-08 NOTE — Telephone Encounter (Signed)
Patient's wife notified and Rx called in as directed.  

## 2014-12-08 NOTE — Telephone Encounter (Signed)
Joelene Millin (spouse) called stating mr Vester is having  Back pain going into  groin and leg area.  He wanted to be referred to pain clinic.  He has appointment Monday @ 8/30 with Dr Darnell Level,.  She wanted to know if dr g would call something in until he can come in on Monday.  She stated he has not taking any pain meds for a month or so.    [facility]

## 2014-12-08 NOTE — Telephone Encounter (Signed)
Would try tramadol may phone in. Referral to pain clinic placed today.

## 2014-12-12 ENCOUNTER — Encounter: Payer: Self-pay | Admitting: Family Medicine

## 2014-12-12 ENCOUNTER — Ambulatory Visit (INDEPENDENT_AMBULATORY_CARE_PROVIDER_SITE_OTHER): Payer: BLUE CROSS/BLUE SHIELD | Admitting: Family Medicine

## 2014-12-12 VITALS — BP 150/82 | HR 88 | Temp 97.7°F | Wt 190.5 lb

## 2014-12-12 DIAGNOSIS — G8929 Other chronic pain: Secondary | ICD-10-CM | POA: Diagnosis not present

## 2014-12-12 DIAGNOSIS — M5410 Radiculopathy, site unspecified: Secondary | ICD-10-CM | POA: Diagnosis not present

## 2014-12-12 DIAGNOSIS — G894 Chronic pain syndrome: Secondary | ICD-10-CM

## 2014-12-12 DIAGNOSIS — M5416 Radiculopathy, lumbar region: Principal | ICD-10-CM

## 2014-12-12 MED ORDER — HYDROCODONE-ACETAMINOPHEN 5-325 MG PO TABS: 1.0000 | ORAL_TABLET | Freq: Three times a day (TID) | ORAL | Status: DC | PRN

## 2014-12-12 MED ORDER — METHOCARBAMOL 500 MG PO TABS: 500.0000 mg | ORAL_TABLET | Freq: Three times a day (TID) | ORAL | Status: AC | PRN

## 2014-12-12 MED ORDER — PREDNISONE 20 MG PO TABS: ORAL_TABLET | ORAL | Status: DC

## 2014-12-12 NOTE — Progress Notes (Signed)
Pre visit review using our clinic review tool, if applicable. No additional management support is needed unless otherwise documented below in the visit note. 

## 2014-12-12 NOTE — Patient Instructions (Addendum)
Pass by [name]'s office for referral to PM&R. Take prednisone taper (dont' mix with ibuporfen). Take robaxin muscle relaxant as well as may use hydrocodone for breakthrough pain. Let me know how you're doing.

## 2014-12-12 NOTE — Progress Notes (Signed)
BP 150/82 mmHg  Pulse 88  Temp(Src) 97.7 F (36.5 C) (Oral)  Wt 190 lb 8 oz (86.41 kg)   CC: back pain and R leg pain  Subjective:    Patient ID: Aaron [name], male    DOB: January 27, 1981, 34 y.o.   MRN: [id number]  HPI: Aaron [name] is a 34 y.o. male presenting on 12/12/2014 for Back Pain   Several week history of worsening back pain with radiation to groin/inside of leg, down to calf. No numbness or weakness. May have tweaked back at work (heavy lifting). Denies specific inciting trauma/injury. Walking improves pain. Lifting and prolonged sitting worsens pain. No fevers/chills, bowel/bladder incontinence or saddle anesthesia.   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   Seen by Dr Christella Noa 07/2014 - rec against surgery. ESI not discussed.  See phone note for details. Last week requested pain clinic referral for chronic back pain. Actually more interested in rpt ESI. We also started tramadol for his pain. This has not helped. Also taking ibuprofen 800mg  bid. Prior on hydrocodone.   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 Interim medical history since our last visit reviewed. Allergies and medications reviewed and updated. Current Outpatient Prescriptions on File Prior to Visit  Medication Sig  . amitriptyline (ELAVIL) 100 MG tablet Take 1 tablet (100 mg total) by mouth at bedtime.  Marland Kitchen amLODipine (NORVASC) 10 MG tablet Take 1 tablet (10 mg total) by mouth daily.  Marland Kitchen aspirin 81 MG tablet Take 81 mg by mouth daily.  Marland Kitchen atorvastatin (LIPITOR) 20 MG tablet Take 1 tablet (20 mg total) by mouth daily.  Marland Kitchen ibuprofen  (ADVIL,MOTRIN) 800 MG tablet Take 800 mg by mouth 2 (two) times daily as needed.  . loperamide (IMODIUM) 2 MG capsule Take 2 mg by mouth 2 (two) times daily as needed for diarrhea or loose stools.   . metoprolol succinate (TOPROL-XL) 25 MG 24 hr tablet Take 1 tablet (25 mg total) by mouth daily.  Marland Kitchen omeprazole (PRILOSEC) 40 MG capsule TAKE 1 CAPSULE (40 MG TOTAL) BY MOUTH DAILY.   No current facility-administered medications on file prior to visit.   Past Medical History  Diagnosis Date  . Hx of colonic polyps   . History of seizure disorder childhood    last seizure at 34 y/o, currently off meds (?febrile, heat induced)  . HTN (hypertension)   . Chronic diarrhea     thought due to IBS, s/p colonoscopies in past  . Chronic radicular pain of lower back 2010    R>L after injuring back while loading truck - reviewed clinic notes from [facility] (was on precocet/flexeril/motrin)  . Retained metal fragment 03/2012    R lat abd wall by CT scan  . Fatty liver 03/2012    by CT, mild HSM  . Diverticulosis 03/2012    left sided by CT  . Chronic pain syndrome 2010    back injury  . Smoker   . HLD (hyperlipidemia) 06/30/2014  . Abnormal drug screen 09/2014    09/2014 - inapprop neg hydrocodone (mod risk)    Past Surgical History  Procedure Laterality Date  .  Appendectomy  2013    2014 repeat surgery for stump appendix (at [facility])  . Colonoscopy  06/2012    ext hemm, 1 rectal polyp ([facility]) [place] yrs ([facility])  . Colonoscopy  07/2013    hypertrophied anal papillae, hemm, normal biopsies ([facility])  . Esi  2012    effective x1 mo  . Discogram      L2/3, L3/4, L5/S1 HNP with pinched nerves  . Nuclear stress test  02/2014    EF 64%, fixed inferior wall defect thought diaphragm/artifact Doylene Canard)  . Mri  02/2014    mild congenital narrowing of spine as well as mild bulging disc at L5/S1   History   Social History  . Marital Status: Married    Spouse Name: N/A  . Number of Children: N/A  .  Years of Education: N/A   Occupational History  . Not on file.   Social History Main Topics  . Smoking status: Current Some Day Smoker    Types: Cigarettes    Start date: 06/03/1996  . Smokeless tobacco: Never Used     Comment: 3 cigarettes/day  . Alcohol Use: 0.0 oz/week    0 Standard drinks or equivalent per week     Comment: Rare  . Drug Use: No  . Sexual Activity: Not on file   Other Topics Concern  . 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 No distress.  Musculoskeletal: He exhibits no edema.  + tender lower thoracic and upper lumbar midline spine to palpation + paraspinous lumbar and lower thoracic mm tenderness ++ SLR R>L leg No pain with int/ext rotation at hip Neg FABER. Fluid movement overall  Skin: Skin is warm and dry. No rash noted.  Psychiatric: He has a normal mood and affect.  Nursing note and vitals reviewed.     Assessment & Plan:   Problem List Items Addressed This Visit    Chronic pain syndrome   Relevant Orders   Ambulatory referral to Physical Medicine Rehab   Chronic radicular pain of lower back - Primary    Interested in referral to PM&R to discuss ESI. Referral placed. Will also treat acute flare with prednisone taper, robaxin muscle relaxant, and hydrocodone for breakthrough pain (#30 provided  today). Pt agrees with plan.      Relevant Medications   methocarbamol (ROBAXIN) 500 MG tablet   Other Relevant Orders   Ambulatory referral to Physical Medicine Rehab       Follow up plan: Return if symptoms worsen or fail to improve.

## 2014-12-12 NOTE — Assessment & Plan Note (Signed)
Interested in referral to PM&R to discuss ESI. Referral placed. Will also treat acute flare with prednisone taper, robaxin muscle relaxant, and hydrocodone for breakthrough pain (#30 provided today). Pt agrees with plan.

## 2014-12-26 ENCOUNTER — Telehealth: Payer: Self-pay | Admitting: Family Medicine

## 2014-12-26 NOTE — Telephone Encounter (Signed)
Called [facility] Dr [facility] [place] office, Appt made for 01/31/15 at 8am, patient is aware.

## 2014-12-26 NOTE — Telephone Encounter (Signed)
Pt called checking on referral to pain management

## 2014-12-30 ENCOUNTER — Other Ambulatory Visit: Payer: Self-pay

## 2014-12-30 NOTE — Telephone Encounter (Signed)
I'm not positive that Dr. Darnell Level would be providing refills for this medication. It may have to wait until his return.

## 2014-12-30 NOTE — Telephone Encounter (Signed)
Pt left v/m requesting rx hydrocodone apap. Call when ready for pick up. rx last printed # 30 on 12/12/14. Pt was also seen on 12/12/14. Pt has appt with pain mgt on 01/31/15.Please advise.

## 2014-12-30 NOTE — Telephone Encounter (Signed)
Spoke to wife--okay per HIPAA- and is aware that this will have to wait until next week when dr Darnell Level returns

## 2015-01-02 ENCOUNTER — Telehealth: Payer: Self-pay | Admitting: Family Medicine

## 2015-01-02 MED ORDER — HYDROCODONE-ACETAMINOPHEN 5-325 MG PO TABS: 1.0000 | ORAL_TABLET | Freq: Three times a day (TID) | ORAL | Status: DC | PRN

## 2015-01-02 NOTE — Addendum Note (Signed)
Addended by: Ria Bush on: 01/02/2015 01:56 PM   Modules accepted: Orders

## 2015-01-02 NOTE — Telephone Encounter (Addendum)
Printed and in Kim's box. Will refill x1. Does he have appt with PM&R?

## 2015-01-02 NOTE — Telephone Encounter (Signed)
Message left advising patient and Rx placed up front for pick up. According to first message below-he has appt with PM on 01-31-15.

## 2015-01-02 NOTE — Telephone Encounter (Signed)
Patient's wife called and asked for Maudie Mercury to call patient back about refill request for pain medication.

## 2015-01-02 NOTE — Telephone Encounter (Signed)
Message left advising that Rx was ready for pick up and up front.

## 2015-01-12 ENCOUNTER — Other Ambulatory Visit: Payer: Self-pay | Admitting: Family Medicine

## 2015-01-17 ENCOUNTER — Emergency Department (HOSPITAL_COMMUNITY)
Admission: EM | Admit: 2015-01-17 | Discharge: 2015-01-17 | Disposition: A | Discharge status: Home / Self Care | Payer: BLUE CROSS/BLUE SHIELD | Attending: Emergency Medicine | Admitting: Emergency Medicine

## 2015-01-17 ENCOUNTER — Emergency Department (HOSPITAL_COMMUNITY): Payer: BLUE CROSS/BLUE SHIELD

## 2015-01-17 ENCOUNTER — Encounter (HOSPITAL_COMMUNITY): Payer: Self-pay | Admitting: *Deleted

## 2015-01-17 DIAGNOSIS — K5190 Ulcerative colitis, unspecified, without complications: Secondary | ICD-10-CM | POA: Insufficient documentation

## 2015-01-17 DIAGNOSIS — E785 Hyperlipidemia, unspecified: Secondary | ICD-10-CM | POA: Diagnosis not present

## 2015-01-17 DIAGNOSIS — R1011 Right upper quadrant pain: Secondary | ICD-10-CM

## 2015-01-17 DIAGNOSIS — I10 Essential (primary) hypertension: Secondary | ICD-10-CM | POA: Insufficient documentation

## 2015-01-17 DIAGNOSIS — Z86010 Personal history of colonic polyps: Secondary | ICD-10-CM | POA: Insufficient documentation

## 2015-01-17 DIAGNOSIS — Z9049 Acquired absence of other specified parts of digestive tract: Secondary | ICD-10-CM | POA: Diagnosis not present

## 2015-01-17 DIAGNOSIS — G894 Chronic pain syndrome: Secondary | ICD-10-CM | POA: Insufficient documentation

## 2015-01-17 DIAGNOSIS — K6389 Other specified diseases of intestine: Secondary | ICD-10-CM | POA: Insufficient documentation

## 2015-01-17 DIAGNOSIS — Z720 Tobacco use: Secondary | ICD-10-CM | POA: Diagnosis not present

## 2015-01-17 DIAGNOSIS — Z7982 Long term (current) use of aspirin: Secondary | ICD-10-CM | POA: Diagnosis not present

## 2015-01-17 DIAGNOSIS — K529 Noninfective gastroenteritis and colitis, unspecified: Secondary | ICD-10-CM

## 2015-01-17 DIAGNOSIS — Z79899 Other long term (current) drug therapy: Secondary | ICD-10-CM | POA: Insufficient documentation

## 2015-01-17 LAB — COMPREHENSIVE METABOLIC PANEL
ALBUMIN: 4.2 g/dL (ref 3.5–5.0)
ALK PHOS: 73 U/L (ref 38–126)
ALT: 32 U/L (ref 17–63)
AST: 24 U/L (ref 15–41)
Anion gap: 9 (ref 5–15)
BILIRUBIN TOTAL: 0.6 mg/dL (ref 0.3–1.2)
CALCIUM: 9.1 mg/dL (ref 8.9–10.3)
CO2: 26 mmol/L (ref 22–32)
Chloride: 101 mmol/L (ref 101–111)
Creatinine, Ser: 1.13 mg/dL (ref 0.61–1.24)
GFR calc Af Amer: >60 mL/min (ref >60)
GFR calc non Af Amer: >60 mL/min (ref >60)
GLUCOSE: 109 mg/dL — AB (ref 65–99)
POTASSIUM: 3.3 mmol/L — AB (ref 3.5–5.1)
Sodium: 136 mmol/L (ref 135–145)
TOTAL PROTEIN: 7.2 g/dL (ref 6.5–8.1)

## 2015-01-17 LAB — CBC
HEMATOCRIT: 44.9 % (ref 39.0–52.0)
Hemoglobin: 15.8 g/dL (ref 13.0–17.0)
MCH: 31.5 pg (ref 26.0–34.0)
MCHC: 35.2 g/dL (ref 30.0–36.0)
MCV: 89.4 fL (ref 78.0–100.0)
Platelets: 175 10*3/uL (ref 150–400)
RBC: 5.02 MIL/uL (ref 4.22–5.81)
RDW: 12.4 % (ref 11.5–15.5)
WBC: 6.9 10*3/uL (ref 4.0–10.5)

## 2015-01-17 LAB — URINALYSIS, ROUTINE W REFLEX MICROSCOPIC
BILIRUBIN URINE: NEGATIVE
GLUCOSE, UA: NEGATIVE mg/dL
HGB URINE DIPSTICK: NEGATIVE
Ketones, ur: NEGATIVE mg/dL
Leukocytes, UA: NEGATIVE
Nitrite: NEGATIVE
Protein, ur: NEGATIVE mg/dL
SPECIFIC GRAVITY, URINE: 1.01 (ref 1.005–1.030)
UROBILINOGEN UA: 0.2 mg/dL (ref 0.0–1.0)
pH: 6 (ref 5.0–8.0)

## 2015-01-17 LAB — LIPASE, BLOOD: LIPASE: 28 U/L (ref 22–51)

## 2015-01-17 MED ORDER — FENTANYL CITRATE (PF) 100 MCG/2ML IJ SOLN
50.0000 ug | Freq: Once | INTRAMUSCULAR | Status: AC
  Administered 2015-01-17: 50 ug via INTRAVENOUS
  Filled 2015-01-17: qty 2

## 2015-01-17 MED ORDER — PREDNISONE 20 MG PO TABS: ORAL_TABLET | ORAL | Status: DC

## 2015-01-17 MED ORDER — OXYCODONE-ACETAMINOPHEN 5-325 MG PO TABS: 1.0000 | ORAL_TABLET | ORAL | Status: DC | PRN

## 2015-01-17 MED ORDER — ONDANSETRON HCL 4 MG/2ML IJ SOLN
4.0000 mg | Freq: Once | INTRAMUSCULAR | Status: AC
  Administered 2015-01-17: 4 mg via INTRAVENOUS
  Filled 2015-01-17: qty 2

## 2015-01-17 MED ORDER — METRONIDAZOLE 500 MG PO TABS
500.0000 mg | ORAL_TABLET | Freq: Once | ORAL | Status: AC
  Administered 2015-01-17: 500 mg via ORAL
  Filled 2015-01-17: qty 1

## 2015-01-17 MED ORDER — IOHEXOL 300 MG/ML  SOLN
25.0000 mL | Freq: Once | INTRAMUSCULAR | Status: AC | PRN
  Administered 2015-01-17: 25 mL via ORAL

## 2015-01-17 MED ORDER — SODIUM CHLORIDE 0.9 % IV BOLUS (SEPSIS)
1000.0000 mL | Freq: Once | INTRAVENOUS | Status: AC
  Administered 2015-01-17: 1000 mL via INTRAVENOUS

## 2015-01-17 MED ORDER — IOHEXOL 300 MG/ML  SOLN
100.0000 mL | Freq: Once | INTRAMUSCULAR | Status: AC | PRN
  Administered 2015-01-17: 100 mL via INTRAVENOUS

## 2015-01-17 MED ORDER — CIPROFLOXACIN HCL 500 MG PO TABS: 500.0000 mg | ORAL_TABLET | Freq: Two times a day (BID) | ORAL | Status: DC

## 2015-01-17 MED ORDER — CIPROFLOXACIN HCL 500 MG PO TABS
500.0000 mg | ORAL_TABLET | Freq: Once | ORAL | Status: AC
  Administered 2015-01-17: 500 mg via ORAL
  Filled 2015-01-17: qty 1

## 2015-01-17 MED ORDER — PREDNISONE 20 MG PO TABS
60.0000 mg | ORAL_TABLET | Freq: Once | ORAL | Status: AC
  Administered 2015-01-17: 60 mg via ORAL
  Filled 2015-01-17: qty 3

## 2015-01-17 MED ORDER — METRONIDAZOLE 500 MG PO TABS: 500.0000 mg | ORAL_TABLET | Freq: Four times a day (QID) | ORAL | Status: DC

## 2015-01-17 NOTE — ED Notes (Signed)
Pt back from CT

## 2015-01-17 NOTE — ED Provider Notes (Signed)
CSN: [id number]     Arrival date & time 01/17/15  0058 History   This chart was scribed for Rolland Porter, MD by Randa Evens, ED Scribe. This patient was seen in room A04C/A04C and the patient's care was started at 1:27 AM.      Chief Complaint  Patient presents with  . Abdominal Pain   The history is provided by the patient. No language interpreter was used.   HPI Comments: [name] is a 34 y.o. male with PMHx listed below who presents to the Emergency Department complaining of worsening  RUQ abdominal pain onset earlier this morning. Pt reports diarhea x10, nausea and diaphoresis. He reports rarely seeing blood which she attributes to hemorrhoids. Pt states that the pain began in the umbilical region progressed to the RUQ and radiates straight back in between is shoulder blades. Pt states that laying on right side, walking, deep breathing makes pain worse. No alleviating factors. Pt denies vomiting or other related symptoms. Pt reports Hx of ulcerative colitis. Pt states that this pain is not like previous ulcerative colitis pain, he states that the pain from that is usually lower in the abdomen. Pt states that he is a 1 pack per day smoker. Pt reports occasional alcohol use. Pt reports HX of appendectomy. He has a family history of ulcerative colitis.  PCP Dr Danise [facility]ina [facility] in [place]  Past Medical History  Diagnosis Date  . Hx of colonic polyps   . History of seizure disorder childhood    last seizure at 34 y/o, currently off meds (?febrile, heat induced)  . HTN (hypertension)   . Chronic diarrhea     thought due to IBS, s/p colonoscopies in past  . 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 Retained metal fragment 03/2012    R lat abd wall by CT scan  . Fatty liver 03/2012    by CT, mild HSM  . Diverticulosis 03/2012    left sided by CT   . Chronic pain syndrome 2010    back injury  . Smoker   . HLD (hyperlipidemia) 06/30/2014  . Abnormal drug screen 09/2014    09/2014 - inapprop neg hydrocodone (mod risk)   Past Surgical History  Procedure Laterality Date  . Appendectomy  2013    2014 repeat surgery for stump appendix (at [place])  . Colonoscopy  06/2012    ext hemm, 1 rectal polyp (TA) [place]rs [place])  . Colonoscopy  07/2013    hypertrophied anal papillae, hemm, normal biopsies ([facility])  . Esi  2012    effective x1 mo  . Discogram      L2/3, L3/4, L5/S1 HNP with pinched nerves  . Nuclear stress test  02/2014    EF 64%, fixed inferior wall defect thought diaphragm/artifact [facility])  . Mri  02/2014    mild congenital narrowing of spine as well as mild bulging disc at L5/S1   Family History  Problem Relation Age of Onset  . CAD Father 36    massive MI  . Cancer Other     colon - 4 maternal and paternal aunts and uncles  . Cancer Maternal Grandfather     colon  . Cancer Maternal Aunt     breast  . Hypertension Father   . Stroke Neg Hx   . Diabetes Neg Hx   .  GI Disease Other     crohn's and UC   Social History  Substance Use Topics  . Smoking status: Current Some Day Smoker    Types: Cigarettes    Start date: 06/03/1996  . Smokeless tobacco: Never Used     Comment: 3 cigarettes/day  . Alcohol Use: 0.0 oz/week    0 Standard drinks or equivalent per week     Comment: Rare  employed  Review of Systems  Gastrointestinal: Positive for nausea, abdominal pain and diarrhea. Negative for vomiting.  Musculoskeletal: Positive for back pain.  All other systems reviewed and are negative.    Allergies  Sertraline  Home Medications   Prior to Admission medications   Medication Sig Start Date End Date Taking? Authorizing Provider  amitriptyline (ELAVIL) 100 MG tablet Take 1 tablet (100 mg total) by mouth at bedtime. 07/15/14  Yes Ria Bush, MD  amLODipine (NORVASC) 10 MG tablet TAKE 1 TABLET (10  MG TOTAL) BY MOUTH DAILY. 01/12/15  Yes Ria Bush, MD  aspirin 81 MG tablet Take 81 mg by mouth daily.   Yes Historical Provider, MD  atorvastatin (LIPITOR) 20 MG tablet Take 1 tablet (20 mg total) by mouth daily. 02/17/14  Yes Ria Bush, MD  HYDROcodone-acetaminophen (NORCO/VICODIN) 5-325 MG per tablet Take 1 tablet by mouth every 8 (eight) hours as needed for moderate pain. 01/02/15  Yes Ria Bush, MD  ibuprofen (ADVIL,MOTRIN) 800 MG tablet Take 800 mg by mouth 2 (two) times daily as needed.   Yes Historical Provider, MD  loperamide (IMODIUM) 2 MG capsule Take 2 mg by mouth 2 (two) times daily as needed for diarrhea or loose stools.    Yes Historical Provider, MD  methocarbamol (ROBAXIN) 500 MG tablet Take 1 tablet (500 mg total) by mouth 3 (three) times daily as needed for muscle spasms (sedation precautions). 12/12/14  Yes Ria Bush, MD  metoprolol succinate (TOPROL-XL) 25 MG 24 hr tablet Take 1 tablet (25 mg total) by mouth daily. 07/15/14  Yes Ria Bush, MD  ciprofloxacin (CIPRO) 500 MG tablet Take 1 tablet (500 mg total) by mouth 2 (two) times daily. 01/17/15   Rolland Porter, MD  metroNIDAZOLE (FLAGYL) 500 MG tablet Take 1 tablet (500 mg total) by mouth 4 (four) times daily. 01/17/15   Rolland Porter, MD  oxyCODONE-acetaminophen (PERCOCET/ROXICET) 5-325 MG per tablet Take 1 tablet by mouth every 4 (four) hours as needed for severe pain. 01/17/15   Rolland Porter, MD  predniSONE (DELTASONE) 20 MG tablet Take 3 po QD x 3d , then 2 po QD x 3d then 1 po QD x 3d 01/17/15   Rolland Porter, MD   BP 144/93 mmHg  Pulse 84  Temp(Src) 98 F (36.7 C) (Oral)  Resp 20  Ht 5\' 11"  (1.803 m)  Wt 187 lb 8 oz (85.049 kg)  BMI 26.16 kg/m2  SpO2 100%   Physical Exam  Constitutional: He is oriented to person, place, and time. He appears well-developed and well-nourished.  Non-toxic appearance. He does not appear ill. No distress.  HENT:  Head: Normocephalic and atraumatic.  Right Ear: External ear  normal.  Left Ear: External ear normal.  Nose: Nose normal. No mucosal edema or rhinorrhea.  Mouth/Throat: Oropharynx is clear and moist and mucous membranes are normal. No dental abscesses or uvula swelling.  Eyes: Conjunctivae and EOM are normal. Pupils are equal, round, and reactive to light.  Neck: Normal range of motion and full passive range of motion without pain. Neck supple.  Cardiovascular:  Normal rate, regular rhythm and normal heart sounds.  Exam reveals no gallop and no friction rub.   No murmur heard. Pulmonary/Chest: Effort normal and breath sounds normal. No respiratory distress. He has no wheezes. He has no rhonchi. He has no rales. He exhibits no tenderness and no crepitus.  Abdominal: Soft. Normal appearance and bowel sounds are normal. He exhibits no distension. There is tenderness in the right upper quadrant and suprapubic area. There is no rebound and no guarding.  Tender in RUQ and mildly tender in suprapubic region.   Musculoskeletal: Normal range of motion. He exhibits no edema or tenderness.  Moves all extremities well.   Neurological: He is alert and oriented to person, place, and time. He has normal strength. No cranial nerve deficit.  Skin: Skin is warm, dry and intact. No rash noted. No erythema. No pallor.  Psychiatric: He has a normal mood and affect. His speech is normal and behavior is normal. His mood appears not anxious.  Nursing note and vitals reviewed.   ED Course  Procedures (including critical care time)  Medications  sodium chloride 0.9 % bolus 1,000 mL (0 mLs Intravenous Stopped 01/17/15 0713)  fentaNYL (SUBLIMAZE) injection 50 mcg (50 mcg Intravenous Given 01/17/15 0200)  ondansetron (ZOFRAN) injection 4 mg (4 mg Intravenous Given 01/17/15 0200)  iohexol (OMNIPAQUE) 300 MG/ML solution 100 mL (100 mLs Intravenous Contrast Given 01/17/15 0235)  iohexol (OMNIPAQUE) 300 MG/ML solution 25 mL (25 mLs Oral Contrast Given 01/17/15 0235)  fentaNYL (SUBLIMAZE)  injection 50 mcg (50 mcg Intravenous Given 01/17/15 0529)  ciprofloxacin (CIPRO) tablet 500 mg (500 mg Oral Given 01/17/15 0722)  metroNIDAZOLE (FLAGYL) tablet 500 mg (500 mg Oral Given 01/17/15 0722)  predniSONE (DELTASONE) tablet 60 mg (60 mg Oral Given 01/17/15 0722)    DIAGNOSTIC STUDIES: Oxygen Saturation is 100% on RA, normal by my interpretation.    COORDINATION OF CARE: 1:39 AM-Discussed treatment plan with pt at bedside and pt agreed to plan. Pt given IV fluids and IV pain medication.   1:42 AM- Reviewd AP Ct scan with contrast from June 28, 2013 and it was normal with out gallstones or kidney stones.   02:05 Pt was given his test results. We discussed doing a AP CT and he is agreeable.  05:00 Pt given his CT results, will do [place] to make sure not a gallbladder issue since his pain is not in the lower abdomen where you would expect his pain to be located based on his CT scan.  At time of discharge patient was given his ultrasound results. We discussed possible admission or outpatient treatment and he is agreeable to be treated as an outpatient. He states this episode is like similar episodes he's had before. He was given a printout from Wikipedia about the epiploic appendagitis. We discussed it was painful and would heal on its own without intervention. He was also started on steroids and antibiotics for possible ulcerative colitis flare with his diarrhea.    Labs Review Results for orders placed or performed during the hospital encounter of 01/17/15  Lipase, blood  Result Value Ref Range   Lipase 28 22 - 51 U/L  Comprehensive metabolic panel  Result Value Ref Range   Sodium 136 135 - 145 mmol/L   Potassium 3.3 (L) 3.5 - 5.1 mmol/L   Chloride 101 101 - 111 mmol/L   CO2 26 22 - 32 mmol/L   Glucose, Bld 109 (H) 65 - 99 mg/dL   BUN <5 (L) 6 -  20 mg/dL   Creatinine, Ser 1.13 0.61 - 1.24 mg/dL   Calcium 9.1 8.9 - 10.3 mg/dL   Total Protein 7.2 6.5 - 8.1 g/dL   Albumin 4.2 3.5 -  5.0 g/dL   AST 24 15 - 41 U/L   ALT 32 17 - 63 U/L   Alkaline Phosphatase 73 38 - 126 U/L   Total Bilirubin 0.6 0.3 - 1.2 mg/dL   GFR calc non Af Amer >60 >60 mL/min   GFR calc Af Amer >60 >60 mL/min   Anion gap 9 5 - 15  CBC  Result Value Ref Range   WBC 6.9 4.0 - 10.5 K/uL   RBC 5.02 4.22 - 5.81 MIL/uL   Hemoglobin 15.8 13.0 - 17.0 g/dL   HCT 44.9 39.0 - 52.0 %   MCV 89.4 78.0 - 100.0 fL   MCH 31.5 26.0 - 34.0 pg   MCHC 35.2 30.0 - 36.0 g/dL   RDW 12.4 11.5 - 15.5 %   Platelets 175 150 - 400 K/uL  Urinalysis, Routine w reflex microscopic (not at [facility])  Result Value Ref Range   Color, Urine YELLOW YELLOW   APPearance CLEAR CLEAR   Specific Gravity, Urine 1.010 1.005 - 1.030   pH 6.0 5.0 - 8.0   Glucose, UA NEGATIVE NEGATIVE mg/dL   Hgb urine dipstick NEGATIVE NEGATIVE   Bilirubin Urine NEGATIVE NEGATIVE   Ketones, ur NEGATIVE NEGATIVE mg/dL   Protein, ur NEGATIVE NEGATIVE mg/dL   Urobilinogen, UA 0.2 0.0 - 1.0 mg/dL   Nitrite NEGATIVE NEGATIVE   Leukocytes, UA NEGATIVE NEGATIVE   Laboratory interpretation all normal      Imaging Review Ct Abdomen Pelvis W Contrast  01/17/2015   CLINICAL DATA:  Acute onset of right upper quadrant abdominal pain, nausea and vomiting. Diarrhea. History of ulcerative colitis. Initial encounter.  EXAM: CT ABDOMEN AND PELVIS WITH CONTRAST  TECHNIQUE: Multidetector CT imaging of the abdomen and pelvis was performed using the standard protocol following bolus administration of intravenous contrast.  CONTRAST:  138mL OMNIPAQUE IOHEXOL 300 MG/ML  SOLN  COMPARISON:  CT of the abdomen and pelvis from 06/28/2013, and MRI of the lumbar spine performed 02/24/2014  FINDINGS: The visualized lung bases are clear.  The liver and spleen are unremarkable in appearance. The gallbladder is within normal limits. The pancreas and adrenal glands are unremarkable.  The kidneys are unremarkable in appearance. There is no evidence of hydronephrosis. No renal or ureteral  stones are seen. No perinephric stranding is appreciated.  No free fluid is identified. The small bowel is unremarkable in appearance. The stomach is within normal limits. No acute vascular abnormalities are seen.  The patient is status post appendectomy. There is diffuse fatty infiltration of the wall of the cecum and ascending colon, likely reflecting sequelae of chronic inflammation.  Focal soft tissue inflammation is noted along the mid to distal sigmoid colon, with inflammation about an epiploic appendage at the mid sigmoid colon. This likely reflects the patient's ulcerative colitis. There is no evidence of perforation or abscess formation at this time.  The bladder is mildly distended and grossly unremarkable. The prostate remains normal in size, with minimal calcification. No inguinal lymphadenopathy is seen.  No acute osseous abnormalities are identified.  IMPRESSION: 1. Focal soft tissue inflammation along the mid to distal sigmoid colon, with inflammation about an epiploic appendage at the mid sigmoid colon. This likely reflects the patient's ulcerative colitis. No evidence of perforation or abscess formation at this time. 2. Diffuse  fatty infiltration of the wall of the cecum and ascending colon likely reflects sequelae of chronic inflammation.   Electronically Signed   By: Garald Balding M.D.   On: 01/17/2015 03:05   US Abdomen Limited Ruq  01/17/2015   CLINICAL DATA:  Right upper quadrant pain for 1 day  EXAM: US ABDOMEN LIMITED - RIGHT UPPER QUADRANT  COMPARISON:  Abdominal CT from earlier the same day.  FINDINGS: Gallbladder:  No gallstones or wall thickening visualized. No sonographic Murphy sign noted.  Common bile duct:  Diameter: 4 mm  Liver:  No focal lesion identified. Within normal limits in parenchymal echogenicity. Antegrade flow in the imaged portal venous system.  IMPRESSION: Normal right upper quadrant ultrasound.   Electronically Signed   By: Monte Fantasia M.D.   On: 01/17/2015  06:08   I, No att. providers found, personally reviewed and evaluated these images and lab results as part of my medical decision-making.   EKG Interpretation None      MDM   Final diagnoses:  RUQ pain  Epiploic appendagitis  Ulcerative colitis, without complications   Discharge Medication List as of 01/17/2015  7:23 AM    START taking these medications   Details  ciprofloxacin (CIPRO) 500 MG tablet Take 1 tablet (500 mg total) by mouth 2 (two) times daily., Starting 01/17/2015, Until Discontinued, Print    metroNIDAZOLE (FLAGYL) 500 MG tablet Take 1 tablet (500 mg total) by mouth 4 (four) times daily., Starting 01/17/2015, Until Discontinued, Print    oxyCODONE-acetaminophen (PERCOCET/ROXICET) 5-325 MG per tablet Take 1 tablet by mouth every 4 (four) hours as needed for severe pain., Starting 01/17/2015, Until Discontinued, Print        Plan discharge  Rolland Porter, MD, FACEP    I personally performed the services described in this documentation, which was scribed in my presence. The recorded information has been reviewed and considered.  Rolland Porter, MD, Barbette Or, MD 01/17/15 [phone]

## 2015-01-17 NOTE — ED Notes (Signed)
Dr. Knapp at bedside at this time. 

## 2015-01-17 NOTE — ED Notes (Signed)
Dr. Tomi Bamberger at bedside at this time.

## 2015-01-17 NOTE — ED Notes (Signed)
Patient presents with c/o pain to the RUQ that woke him up from sleep

## 2015-01-17 NOTE — ED Notes (Signed)
Pt verbalizes understanding of discharge instructions. NAD on departure. Rates pain 4/10. Ambulatory with steady gait. A/O x4.

## 2015-01-17 NOTE — ED Notes (Signed)
Patient transported to Ultrasound 

## 2015-01-17 NOTE — Discharge Instructions (Signed)
Drink plenty of fluids and avoid fried, spicy or greasy foods for the next 1-2 weeks. Take the antibiotics until gone. Recheck if you get a fever, have uncontrolled vomiting, diarrhea, or your abdominal pain gets worse. Let your gastroenterologist know about your ED visit today.

## 2015-01-19 ENCOUNTER — Ambulatory Visit (INDEPENDENT_AMBULATORY_CARE_PROVIDER_SITE_OTHER): Payer: BLUE CROSS/BLUE SHIELD | Admitting: Primary Care

## 2015-01-19 ENCOUNTER — Encounter: Payer: Self-pay | Admitting: Primary Care

## 2015-01-19 ENCOUNTER — Telehealth: Payer: Self-pay | Admitting: Primary Care

## 2015-01-19 VITALS — BP 164/94 | HR 98 | Temp 98.0°F | Ht 71.0 in | Wt 191.0 lb

## 2015-01-19 DIAGNOSIS — R1084 Generalized abdominal pain: Secondary | ICD-10-CM

## 2015-01-19 DIAGNOSIS — R110 Nausea: Secondary | ICD-10-CM

## 2015-01-19 DIAGNOSIS — R197 Diarrhea, unspecified: Secondary | ICD-10-CM | POA: Diagnosis not present

## 2015-01-19 MED ORDER — DIPHENOXYLATE-ATROPINE 2.5-0.025 MG PO TABS: 1.0000 | ORAL_TABLET | Freq: Four times a day (QID) | ORAL | Status: AC | PRN

## 2015-01-19 MED ORDER — ONDANSETRON 4 MG PO TBDP: 4.0000 mg | ORAL_TABLET | Freq: Three times a day (TID) | ORAL | Status: AC | PRN

## 2015-01-19 NOTE — Progress Notes (Signed)
Pre visit review using our clinic review tool, if applicable. No additional management support is needed unless otherwise documented below in the visit note. 

## 2015-01-19 NOTE — Telephone Encounter (Signed)
Called and notified patient of Kate's comments. Patient verbalized understanding then phone got disconnected.

## 2015-01-19 NOTE — Progress Notes (Signed)
Subjective:    Patient ID: [name], male    DOB: March 14, 1981, 34 y.o.   MRN: [id number]  HPI  Aaron [name] is a 34 year old male who presents today for hospital follow up. He was evaluated on 8/16 at [name] with a chief complaint of RUQ abdominal pain. His RUQ abdominal pain began early that morning which woke him from sleep. He has a history of ulcerative colitis, appendectomy, and 1 PPD tobacco use.   He underwent imaging including CT abdomen/pelvis and Ultrasound of RUQ. He was found to have inflammation along the mid to distal sigmoid colon representing UC via CT and normal RUQ ultrasound. CBC, CMP, and Lipase mostly unremarkable. He was treated with IV fluids, pain medication, and antibiotics consisting of cipro and flagyl with prednisone; and discharged home with outpatient follow up.  Since his visit to the ED he's continuing to experience pain to the peri-umbilical region with radiation to RUQ. He also reports bloating, decreased appetite, diarrhea (8-10 times daily), and nausea. He's able to keep solids and liquids down, but has not had much of an appetite. Denies fevers, bloody stools. He is continuing to take his antibiotics and prednisone as prescribed in the ED. He's been taking imodium for diarrhea without relief.   Review of Systems  Constitutional: Negative for fever and chills.  Respiratory: Negative for shortness of breath.   Cardiovascular: Negative for chest pain.  Gastrointestinal: Positive for nausea, abdominal pain and diarrhea. Negative for vomiting and blood in stool.  Neurological: Negative for dizziness.       Past Medical History  Diagnosis Date  . Hx of colonic polyps   . History of seizure disorder childhood    last seizure at 34 y/o, currently off meds (?febrile, heat induced)  . HTN (hypertension)   . Chronic diarrhea     thought due to IBS, s/p colonoscopies in past  . 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 Retained metal fragment 03/2012    R lat abd wall by CT scan  . Fatty liver 03/2012    by CT, mild HSM  . Diverticulosis 03/2012    left sided by CT  . Chronic pain syndrome 2010    back injury  . Smoker   . HLD (hyperlipidemia) 06/30/2014  . Abnormal drug screen 09/2014    09/2014 - inapprop neg hydrocodone (mod risk)    Social History   Social History  . Marital Status: Married    Spouse Name: N/A  . Number of Children: N/A  . Years of Education: N/A   Occupational History  . Not on file.   Social History Main Topics  . Smoking status: Current Some Day Smoker    Types: Cigarettes    Start date: 06/03/1996  . Smokeless tobacco: Never Used     Comment: 3 cigarettes/day  . Alcohol Use: 0.0 oz/week    0 Standard drinks or equivalent per week     Comment: Rare  . Drug Use: No  . Sexual Activity: Not on file   Other Topics Concern  . Not on file   Social History Narrative   Caffeine: 2 cups coffee/day, 1 soda per day   Lives with wife and 2 daughters, 2 dogs and 1 cat   Occupation: accounts at [name]   Edu: master's degree in accounting/finance   Activity: walks on  treadmill at home   Diet: good water, fruits/vegetables daily    Past Surgical History  Procedure Laterality Date  . Appendectomy  2013    2014 repeat [facility] for stump appendix (at [name])  . Colonoscopy  06/2012    ext hemm, 1 rectal polyp (TA) rec rpt 5 yrs ([name])  . Colonoscopy  07/2013    hypertrophied anal papillae, hemm, normal biopsies ([name])  . Esi  2012    effective x1 mo  . Discogram      L2/3, L3/4, L5/S1 HNP with pinched nerves  . Nuclear stress test  02/2014    EF 64%, fixed inferior wall defect thought diaphragm/artifact [name])  . Mri  02/2014    mild congenital narrowing of spine as well as mild bulging disc at L5/S1    Family History  Problem Relation Age of Onset  . CAD Father 58    massive MI   . Cancer Other     colon - 4 maternal and paternal aunts and uncles  . Cancer Maternal Grandfather     colon  . Cancer Maternal Aunt     breast  . Hypertension Father   . Stroke Neg Hx   . Diabetes Neg Hx   . GI Disease Other     crohn's and UC    Allergies  Allergen Reactions  . Sertraline Other (See Comments)    Bad thoughts, lethargic, self harm    Current Outpatient Prescriptions on File Prior to Visit  Medication Sig Dispense Refill  . amitriptyline (ELAVIL) 100 MG tablet Take 1 tablet (100 mg total) by mouth at bedtime. 30 tablet 6  . amLODipine (NORVASC) 10 MG tablet TAKE 1 TABLET (10 MG TOTAL) BY MOUTH DAILY. 30 tablet 3  . aspirin 81 MG tablet Take 81 mg by mouth daily.    Marland Kitchen atorvastatin (LIPITOR) 20 MG tablet Take 1 tablet (20 mg total) by mouth daily. 30 tablet 6  . ciprofloxacin (CIPRO) 500 MG tablet Take 1 tablet (500 mg total) by mouth 2 (two) times daily. 14 tablet 0  . HYDROcodone-acetaminophen (NORCO/VICODIN) 5-325 MG per tablet Take 1 tablet by mouth every 8 (eight) hours as needed for moderate pain. 30 tablet 0  . ibuprofen (ADVIL,MOTRIN) 800 MG tablet Take 800 mg by mouth 2 (two) times daily as needed.    . loperamide (IMODIUM) 2 MG capsule Take 2 mg by mouth 2 (two) times daily as needed for diarrhea or loose stools.     . methocarbamol (ROBAXIN) 500 MG tablet Take 1 tablet (500 mg total) by mouth 3 (three) times daily as needed for muscle spasms (sedation precautions). 40 tablet 0  . metoprolol succinate (TOPROL-XL) 25 MG 24 hr tablet Take 1 tablet (25 mg total) by mouth daily. 30 tablet 3  . metroNIDAZOLE (FLAGYL) 500 MG tablet Take 1 tablet (500 mg total) by mouth 4 (four) times daily. 40 tablet 0  . oxyCODONE-acetaminophen (PERCOCET/ROXICET) 5-325 MG per tablet Take 1 tablet by mouth every 4 (four) hours as needed for severe pain. 20 tablet 0  . predniSONE (DELTASONE) 20 MG tablet Take 3 po QD x 3d , then 2 po QD x 3d then 1 po QD x 3d 18 tablet 0  .  [DISCONTINUED] omeprazole (PRILOSEC) 40 MG capsule TAKE 1 CAPSULE (40 MG TOTAL) BY MOUTH DAILY. (Patient not taking: Reported on 01/17/2015) 30 capsule 6   No current facility-administered medications on file prior to visit.    BP 164/94 mmHg  Pulse  98  Temp(Src) 98 F (36.7 C) (Oral)  Ht 5\' 11"  (1.803 m)  Wt 191 lb (86.637 kg)  BMI 26.65 kg/m2  SpO2 98%    Objective:   Physical Exam  Constitutional: He appears well-nourished. He does not appear ill.  Cardiovascular: Normal rate and regular rhythm.   Pulmonary/Chest: Effort normal and breath sounds normal.  Abdominal: Soft. Normal appearance. Bowel sounds are increased. There is no splenomegaly or hepatomegaly. There is tenderness in the right upper quadrant, right lower quadrant, periumbilical area and left lower quadrant. There is no guarding and negative Murphy's sign.  Skin: Skin is warm and dry.          Assessment & Plan:  ED follow up:  Visit to [facility] on 8/16. CT shows UC. Ultrasound RUQ negative. CBC, CMP, Lipase mostly unremarkable. Periumbilical and RUQ pain with diarrhea continues despite antibiotics and prednisone. Able to tolerate solids and liquids without vomiting. Tender upon exam today to generalized abdomen. Treat diarrhea with RX for lomotil as imodium is not helping, RX for zofran PRN nausea. Referral made to GI for further evaluation. Follow up if development of fevers, inability to tolerate orals.

## 2015-01-19 NOTE — Patient Instructions (Signed)
Start Lomotil for diarrhea. Take 1 tablet by mouth four times daily as needed for diarrhea.  Stop by the front desk and speak with Rosaria Ferries regarding your referral to GI.  Push intake of water and start slowly advancing your diet with the suggestions mentioned below.  It was a pleasure meeting you!  Food Choices to Help Relieve Diarrhea When you have diarrhea, the foods you eat and your eating habits are very important. Choosing the right foods and drinks can help relieve diarrhea. Also, because diarrhea can last up to 7 days, you need to replace lost fluids and electrolytes (such as sodium, potassium, and chloride) in order to help prevent dehydration.  WHAT GENERAL GUIDELINES DO I NEED TO FOLLOW?  Slowly drink 1 cup (8 oz) of fluid for each episode of diarrhea. If you are getting enough fluid, your urine will be clear or pale yellow.  Eat starchy foods. Some good choices include white rice, white toast, pasta, low-fiber cereal, baked potatoes (without the skin), saltine crackers, and bagels.  Avoid large servings of any cooked vegetables.  Limit fruit to two servings per day. A serving is  cup or 1 small piece.  Choose foods with less than 2 g of fiber per serving.  Limit fats to less than 8 tsp (38 g) per day.  Avoid fried foods.  Eat foods that have probiotics in them. Probiotics can be found in certain dairy products.  Avoid foods and beverages that may increase the speed at which food moves through the stomach and intestines (gastrointestinal tract). Things to avoid include:  High-fiber foods, such as dried fruit, raw fruits and vegetables, nuts, seeds, and whole grain foods.  Spicy foods and high-fat foods.  Foods and beverages sweetened with high-fructose corn syrup, honey, or sugar alcohols such as xylitol, sorbitol, and mannitol. WHAT FOODS ARE RECOMMENDED? Grains White rice. White, [place], or pita breads (fresh or toasted), including plain rolls, buns, or bagels. White  pasta. Saltine, soda, or graham crackers. Pretzels. Low-fiber cereal. Cooked cereals made with water (such as cornmeal, farina, or cream cereals). Plain muffins. Matzo. Melba toast. Zwieback.  Vegetables Potatoes (without the skin). Strained tomato and vegetable juices. Most well-cooked and canned vegetables without seeds. Tender lettuce. Fruits Cooked or canned applesauce, apricots, cherries, fruit cocktail, grapefruit, peaches, pears, or plums. Fresh bananas, apples without skin, cherries, grapes, cantaloupe, grapefruit, peaches, oranges, or plums.  Meat and Other Protein Products Baked or boiled chicken. Eggs. Tofu. Fish. Seafood. Smooth peanut butter. Ground or well-cooked tender beef, ham, veal, lamb, pork, or poultry.  Dairy Plain yogurt, kefir, and unsweetened liquid yogurt. Lactose-free milk, buttermilk, or soy milk. Plain hard cheese. Beverages Sport drinks. Clear broths. Diluted fruit juices (except prune). Regular, caffeine-free sodas such as ginger ale. Water. Decaffeinated teas. Oral rehydration solutions. Sugar-free beverages not sweetened with sugar alcohols. Other Bouillon, broth, or soups made from recommended foods.  The items listed above may not be a complete list of recommended foods or beverages. Contact your dietitian for more options. WHAT FOODS ARE NOT RECOMMENDED? Grains Whole grain, whole wheat, bran, or rye breads, rolls, pastas, crackers, and cereals. Wild or brown rice. Cereals that contain more than 2 g of fiber per serving. Corn tortillas or taco shells. Cooked or dry oatmeal. Granola. Popcorn. Vegetables Raw vegetables. Cabbage, broccoli, Brussels sprouts, artichokes, baked beans, beet greens, corn, kale, legumes, peas, sweet potatoes, and yams. Potato skins. Cooked spinach and cabbage. Fruits Dried fruit, including raisins and dates. Raw fruits. Stewed or dried prunes.  Fresh apples with skin, apricots, mangoes, pears, raspberries, and strawberries.  Meat and  Other Protein Products Chunky peanut butter. Nuts and seeds. Beans and lentils. Berniece Salines.  Dairy High-fat cheeses. Milk, chocolate milk, and beverages made with milk, such as milk shakes. Cream. Ice cream. Sweets and Desserts Sweet rolls, doughnuts, and sweet breads. Pancakes and waffles. Fats and Oils Butter. Cream sauces. Margarine. Salad oils. Plain salad dressings. Olives. Avocados.  Beverages Caffeinated beverages (such as coffee, tea, soda, or energy drinks). Alcoholic beverages. Fruit juices with pulp. Prune juice. Soft drinks sweetened with high-fructose corn syrup or sugar alcohols. Other Coconut. Hot sauce. Chili powder. Mayonnaise. Gravy. Cream-based or milk-based soups.  The items listed above may not be a complete list of foods and beverages to avoid. Contact your dietitian for more information. WHAT SHOULD I DO IF I BECOME DEHYDRATED? Diarrhea can sometimes lead to dehydration. Signs of dehydration include dark urine and dry mouth and skin. If you think you are dehydrated, you should rehydrate with an oral rehydration solution. These solutions can be purchased at pharmacies, retail stores, or online.  Drink -1 cup (120-240 mL) of oral rehydration solution each time you have an episode of diarrhea. If drinking this amount makes your diarrhea worse, try drinking smaller amounts more often. For example, drink 1-3 tsp (5-15 mL) every 5-10 minutes.  A general rule for staying hydrated is to drink 1-2 L of fluid per day. Talk to your health care provider about the specific amount you should be drinking each day. Drink enough fluids to keep your urine clear or pale yellow. Document Released: 08/10/2003 Document Revised: 05/25/2013 Document Reviewed: 04/12/2013 [facility] Patient Information 2015 [place], [place]. This information is not intended to replace advice given to you by your health care provider. Make sure you discuss any questions you have with your health care provider.

## 2015-01-19 NOTE — Telephone Encounter (Signed)
Will you notify Aaron [name] that I sent in some ondansetron (Zofran) into his pharamcy for the nausea. These are orally disintegrating tablets and will melt in his mouth. He may take 1 every 6-8 hours as needed. Thanks.

## 2015-02-02 ENCOUNTER — Encounter: Payer: Self-pay | Admitting: Family Medicine

## 2015-02-02 HISTORY — PX: OTHER SURGICAL HISTORY: SHX169

## 2015-02-06 ENCOUNTER — Other Ambulatory Visit: Payer: Self-pay | Admitting: Family Medicine

## 2015-03-29 ENCOUNTER — Other Ambulatory Visit: Payer: Self-pay | Admitting: Family Medicine

## 2015-03-29 DIAGNOSIS — I10 Essential (primary) hypertension: Secondary | ICD-10-CM

## 2015-03-29 DIAGNOSIS — E785 Hyperlipidemia, unspecified: Secondary | ICD-10-CM

## 2015-03-30 ENCOUNTER — Other Ambulatory Visit: Payer: BLUE CROSS/BLUE SHIELD

## 2015-04-04 ENCOUNTER — Encounter: Payer: BLUE CROSS/BLUE SHIELD | Admitting: Family Medicine

## 2015-06-05 ENCOUNTER — Observation Stay (HOSPITAL_COMMUNITY): Payer: Non-veteran care

## 2015-06-05 ENCOUNTER — Observation Stay (HOSPITAL_COMMUNITY)
Admission: AD | Admit: 2015-06-05 | Discharge: 2015-06-06 | Disposition: A | Discharge status: Home / Self Care | Payer: Non-veteran care | Source: Ambulatory Visit | Attending: Cardiovascular Disease | Admitting: Cardiovascular Disease

## 2015-06-05 ENCOUNTER — Encounter (HOSPITAL_COMMUNITY): Payer: Self-pay | Admitting: *Deleted

## 2015-06-05 DIAGNOSIS — F17210 Nicotine dependence, cigarettes, uncomplicated: Secondary | ICD-10-CM | POA: Insufficient documentation

## 2015-06-05 DIAGNOSIS — Z7982 Long term (current) use of aspirin: Secondary | ICD-10-CM | POA: Diagnosis not present

## 2015-06-05 DIAGNOSIS — F419 Anxiety disorder, unspecified: Secondary | ICD-10-CM | POA: Insufficient documentation

## 2015-06-05 DIAGNOSIS — K5730 Diverticulosis of large intestine without perforation or abscess without bleeding: Secondary | ICD-10-CM | POA: Diagnosis not present

## 2015-06-05 DIAGNOSIS — R1032 Left lower quadrant pain: Principal | ICD-10-CM | POA: Insufficient documentation

## 2015-06-05 DIAGNOSIS — G894 Chronic pain syndrome: Secondary | ICD-10-CM | POA: Insufficient documentation

## 2015-06-05 DIAGNOSIS — E785 Hyperlipidemia, unspecified: Secondary | ICD-10-CM | POA: Insufficient documentation

## 2015-06-05 DIAGNOSIS — K529 Noninfective gastroenteritis and colitis, unspecified: Secondary | ICD-10-CM | POA: Diagnosis present

## 2015-06-05 DIAGNOSIS — I10 Essential (primary) hypertension: Secondary | ICD-10-CM | POA: Diagnosis not present

## 2015-06-05 DIAGNOSIS — Z79899 Other long term (current) drug therapy: Secondary | ICD-10-CM | POA: Diagnosis not present

## 2015-06-05 DIAGNOSIS — K580 Irritable bowel syndrome with diarrhea: Secondary | ICD-10-CM | POA: Diagnosis not present

## 2015-06-05 LAB — CBC WITH DIFFERENTIAL/PLATELET
BASOS ABS: 0 10*3/uL (ref 0.0–0.1)
BASOS PCT: 1 %
Eosinophils Absolute: 0.1 10*3/uL (ref 0.0–0.7)
Eosinophils Relative: 1 %
HEMATOCRIT: 42.8 % (ref 39.0–52.0)
HEMOGLOBIN: 14.2 g/dL (ref 13.0–17.0)
LYMPHS PCT: 25 %
Lymphs Abs: 1.5 10*3/uL (ref 0.7–4.0)
MCH: 30.2 pg (ref 26.0–34.0)
MCHC: 33.2 g/dL (ref 30.0–36.0)
MCV: 91.1 fL (ref 78.0–100.0)
MONO ABS: 0.4 10*3/uL (ref 0.1–1.0)
Monocytes Relative: 6 %
NEUTROS ABS: 4 10*3/uL (ref 1.7–7.7)
NEUTROS PCT: 67 %
Platelets: 155 10*3/uL (ref 150–400)
RBC: 4.7 MIL/uL (ref 4.22–5.81)
RDW: 12.3 % (ref 11.5–15.5)
WBC: 6 10*3/uL (ref 4.0–10.5)

## 2015-06-05 LAB — COMPREHENSIVE METABOLIC PANEL
ALT: 30 U/L (ref 17–63)
ANION GAP: 9 (ref 5–15)
AST: 22 U/L (ref 15–41)
Albumin: 3.9 g/dL (ref 3.5–5.0)
Alkaline Phosphatase: 60 U/L (ref 38–126)
BILIRUBIN TOTAL: 0.5 mg/dL (ref 0.3–1.2)
BUN: 7 mg/dL (ref 6–20)
CALCIUM: 9.2 mg/dL (ref 8.9–10.3)
CO2: 25 mmol/L (ref 22–32)
Chloride: 107 mmol/L (ref 101–111)
Creatinine, Ser: 0.92 mg/dL (ref 0.61–1.24)
Glucose, Bld: 99 mg/dL (ref 65–99)
POTASSIUM: 4.1 mmol/L (ref 3.5–5.1)
Sodium: 141 mmol/L (ref 135–145)
TOTAL PROTEIN: 6.3 g/dL — AB (ref 6.5–8.1)

## 2015-06-05 MED ORDER — METRONIDAZOLE IN NACL 5-0.79 MG/ML-% IV SOLN
500.0000 mg | Freq: Three times a day (TID) | INTRAVENOUS | Status: DC
  Administered 2015-06-05 – 2015-06-06 (×4): 500 mg via INTRAVENOUS
  Filled 2015-06-05 (×6): qty 100

## 2015-06-05 MED ORDER — OXYCODONE HCL 5 MG PO TABS
5.0000 mg | ORAL_TABLET | Freq: Four times a day (QID) | ORAL | Status: DC | PRN
  Administered 2015-06-05 – 2015-06-06 (×4): 5 mg via ORAL
  Filled 2015-06-05 (×4): qty 1

## 2015-06-05 MED ORDER — BARIUM SULFATE 2.1 % PO SUSP
450.0000 mL | ORAL | Status: AC
  Administered 2015-06-05: 450 mL via ORAL

## 2015-06-05 MED ORDER — IOHEXOL 300 MG/ML  SOLN
75.0000 mL | Freq: Once | INTRAMUSCULAR | Status: AC | PRN
  Administered 2015-06-05: 75 mL via INTRAVENOUS

## 2015-06-05 MED ORDER — BARIUM SULFATE 2.1 % PO SUSP
ORAL | Status: AC
  Filled 2015-06-05: qty 2

## 2015-06-05 MED ORDER — METRONIDAZOLE IN NACL 5-0.79 MG/ML-% IV SOLN
500.0000 mg | Freq: Four times a day (QID) | INTRAVENOUS | Status: DC
  Filled 2015-06-05 (×3): qty 100

## 2015-06-05 MED ORDER — ENOXAPARIN SODIUM 40 MG/0.4ML ~~LOC~~ SOLN
40.0000 mg | SUBCUTANEOUS | Status: DC
  Administered 2015-06-05 – 2015-06-06 (×2): 40 mg via SUBCUTANEOUS
  Filled 2015-06-05 (×2): qty 0.4

## 2015-06-05 MED ORDER — CIPROFLOXACIN IN D5W 400 MG/200ML IV SOLN
400.0000 mg | Freq: Two times a day (BID) | INTRAVENOUS | Status: DC
  Administered 2015-06-05 – 2015-06-06 (×3): 400 mg via INTRAVENOUS
  Filled 2015-06-05 (×4): qty 200

## 2015-06-05 NOTE — H&P (Signed)
Referring Physician:  [name] is an 35 y.o. male.                       Chief Complaint: abdominal pain with nausea x 5 days.  HPI: 35 year old male has 5 day history of LLQ abdominal pain with nausea. No fever. Also has diarrhea and h/o of irritable bowel syndrome.  Past Medical History  Diagnosis Date  . Hx of colonic polyps   . History of seizure disorder childhood    last seizure at 35 y/o, currently off meds (?febrile, heat induced)  . HTN (hypertension)   . Chronic diarrhea     thought due to IBS, s/p colonoscopies in past  . 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 Retained metal fragment 03/2012    R lat abd wall by CT scan  . Fatty liver 03/2012    by CT, mild HSM  . Diverticulosis 03/2012    left sided by CT  . Chronic pain syndrome 2010    back injury  . Smoker   . HLD (hyperlipidemia) 06/30/2014  . Abnormal drug screen 09/2014    09/2014 - inapprop neg hydrocodone (mod risk)      Past Surgical History  Procedure Laterality Date  . Appendectomy  2013    2014 repeat surgery for stump appendix (at [facility])  . Colonoscopy  06/2012    ext hemm, 1 rectal polyp ([facility]) [place]pt [facility] yrs ([facility])  . Colonoscopy  07/2013    hypertrophied anal papillae, hemm, normal biopsies ([facility])  . Esi  2012    effective x1 mo  . Discogram      L2/3, L3/4, L5/S1 HNP with pinched nerves  . Nuclear stress test  02/2014    EF 64%, fixed inferior wall defect thought diaphragm/artifact Doylene Canard)  . Mri  02/2014    mild congenital narrowing of spine as well as mild bulging disc at L5/S1  . Esi Right 02/2015    pending L1/2 and S1 ([facility])    Family History  Problem Relation Age of Onset  . CAD Father 18    massive MI  . Cancer Other     colon - 4 maternal and paternal aunts and uncles  . Cancer Maternal Grandfather     colon  . Cancer Maternal Aunt     breast   . Hypertension Father   . Stroke Neg Hx   . Diabetes Neg Hx   . GI Disease Other     crohn's and UC   Social History:  reports that he has been smoking Cigarettes.  He started smoking about 19 years ago. He has never used smokeless tobacco. He reports that he drinks alcohol. He reports that he does not use illicit drugs.  Allergies:  Allergies  Allergen Reactions  . Sertraline Other (See Comments)    Bad thoughts, lethargic, self harm    Medications Prior to Admission  Medication Sig Dispense Refill  . amitriptyline (ELAVIL) 100 MG tablet Take 1 tablet (100 mg total) by mouth at bedtime. 30 tablet 6  . amLODipine (NORVASC) 10 MG tablet TAKE 1 TABLET (10 MG TOTAL) BY MOUTH DAILY. 30 tablet 3  . aspirin 81 MG tablet Take 81 mg by mouth daily.    Marland Kitchen atorvastatin (LIPITOR) 20 MG tablet Take 1 tablet (20 mg total) by  mouth daily. 30 tablet 6  . diphenoxylate-atropine (LOMOTIL) 2.5-0.025 MG per tablet Take 1 tablet by mouth 4 (four) times daily as needed for diarrhea or loose stools. 30 tablet 0  . HYDROcodone-acetaminophen (NORCO/VICODIN) 5-325 MG per tablet Take 1 tablet by mouth every 8 (eight) hours as needed for moderate pain. 30 tablet 0  . ibuprofen (ADVIL,MOTRIN) 800 MG tablet Take 800 mg by mouth 2 (two) times daily as needed.    . loperamide (IMODIUM) 2 MG capsule Take 2 mg by mouth 2 (two) times daily as needed for diarrhea or loose stools.     . methocarbamol (ROBAXIN) 500 MG tablet Take 1 tablet (500 mg total) by mouth 3 (three) times daily as needed for muscle spasms (sedation precautions). 40 tablet 0  . metoprolol succinate (TOPROL-XL) 25 MG 24 hr tablet TAKE 1 TABLET (25 MG TOTAL) BY MOUTH DAILY. 30 tablet 3  . ondansetron (ZOFRAN ODT) 4 MG disintegrating tablet Take 1 tablet (4 mg total) by mouth every 8 (eight) hours as needed for nausea or vomiting. 20 tablet 0    No results found for this or any previous visit (from the past 48 hour(s)). No results found.  Review Of  Systems No weight gain or loss. Wears glasses. No eye surgery. No hearing loss, No asthma, No COPD. + dizziness, chest pain. Negative leg edema, claudication. Positive abdominal pain, nausea, diarrhea.. No GI bleed, ulcers, hepatitis or blood transfusion. No kidney stone or hematuria. No stroke, seizures or psychiatric admission, + anxiety No joint pains or skin rash.  Blood pressure 127/83, pulse 75, temperature 98.6 F (37 C), temperature source Oral, resp. rate 17, SpO2 99 %.  Physical Exam: HEENT: [place]/AT, Blue eyes, wears distant glasses, conj-pink, sclera-white, Midline pink tongue. Neck: No JVD, bruit or thyromegaly. Full range of motion. Lungs: Clear, bilateral. Chest wall tender over sternum.Marland Kitchen Heart:-S1, S2 normal. No murmur. Abdomen:-Soft mild epigastric tenderness. Moderate LLQ tnederness with some guarding. Extremities: No edema, cyanosis or clubbing. CNS: Cranial nerves grossly intact. Bilateral equal grips. Skin: Warm and dry.  Assessment/Plan Abdominal pain, LLQ, r/o diverticulitis Hypertension Irritable bowel syndrome Tobbacco use disorder  CT abdomen/Antibiotics  Aaron [name] S, MD  06/05/2015, 2:34 PM

## 2015-06-06 DIAGNOSIS — R1032 Left lower quadrant pain: Secondary | ICD-10-CM | POA: Diagnosis not present

## 2015-06-06 LAB — CBC
HEMATOCRIT: 43.6 % (ref 39.0–52.0)
Hemoglobin: 14.6 g/dL (ref 13.0–17.0)
MCH: 30.4 pg (ref 26.0–34.0)
MCHC: 33.5 g/dL (ref 30.0–36.0)
MCV: 90.6 fL (ref 78.0–100.0)
Platelets: 143 10*3/uL — ABNORMAL LOW (ref 150–400)
RBC: 4.81 MIL/uL (ref 4.22–5.81)
RDW: 12.4 % (ref 11.5–15.5)
WBC: 4.5 10*3/uL (ref 4.0–10.5)

## 2015-06-06 LAB — URINALYSIS, ROUTINE W REFLEX MICROSCOPIC
Bilirubin Urine: NEGATIVE
Glucose, UA: NEGATIVE mg/dL
HGB URINE DIPSTICK: NEGATIVE
Ketones, ur: NEGATIVE mg/dL
LEUKOCYTES UA: NEGATIVE
NITRITE: NEGATIVE
PROTEIN: NEGATIVE mg/dL
pH: 6 (ref 5.0–8.0)

## 2015-06-06 LAB — BASIC METABOLIC PANEL
ANION GAP: 7 (ref 5–15)
BUN: <5 mg/dL — ABNORMAL LOW (ref 6–20)
CALCIUM: 8.8 mg/dL — AB (ref 8.9–10.3)
CO2: 27 mmol/L (ref 22–32)
Chloride: 103 mmol/L (ref 101–111)
Creatinine, Ser: 1.09 mg/dL (ref 0.61–1.24)
GLUCOSE: 102 mg/dL — AB (ref 65–99)
POTASSIUM: 3.9 mmol/L (ref 3.5–5.1)
Sodium: 137 mmol/L (ref 135–145)

## 2015-06-06 MED ORDER — ASPIRIN EC 81 MG PO TBEC
81.0000 mg | DELAYED_RELEASE_TABLET | Freq: Every day | ORAL | Status: DC
  Administered 2015-06-06: 81 mg via ORAL
  Filled 2015-06-06: qty 1

## 2015-06-06 MED ORDER — METOPROLOL SUCCINATE ER 25 MG PO TB24
25.0000 mg | ORAL_TABLET | Freq: Every day | ORAL | Status: DC
  Administered 2015-06-06: 25 mg via ORAL
  Filled 2015-06-06: qty 1

## 2015-06-06 MED ORDER — ATORVASTATIN CALCIUM 20 MG PO TABS
20.0000 mg | ORAL_TABLET | Freq: Every day | ORAL | Status: DC
  Administered 2015-06-06: 20 mg via ORAL
  Filled 2015-06-06: qty 1

## 2015-06-06 MED ORDER — METHOCARBAMOL 500 MG PO TABS
500.0000 mg | ORAL_TABLET | Freq: Three times a day (TID) | ORAL | Status: DC | PRN
  Administered 2015-06-06: 500 mg via ORAL
  Filled 2015-06-06: qty 1

## 2015-06-06 MED ORDER — AMLODIPINE BESYLATE 10 MG PO TABS: 10.0000 mg | ORAL_TABLET | Freq: Every day | ORAL | Status: DC

## 2015-06-06 MED ORDER — HYDROCODONE-ACETAMINOPHEN 5-325 MG PO TABS: 1.0000 | ORAL_TABLET | Freq: Two times a day (BID) | ORAL | Status: AC | PRN

## 2015-06-06 MED ORDER — METRONIDAZOLE 250 MG PO TABS: 250.0000 mg | ORAL_TABLET | Freq: Three times a day (TID) | ORAL | Status: AC

## 2015-06-06 MED ORDER — AMLODIPINE BESYLATE 5 MG PO TABS
5.0000 mg | ORAL_TABLET | Freq: Every day | ORAL | Status: DC
  Administered 2015-06-06: 5 mg via ORAL
  Filled 2015-06-06: qty 1

## 2015-06-06 MED ORDER — ASPIRIN 81 MG PO TABS: 81.0000 mg | ORAL_TABLET | Freq: Every day | ORAL | Status: DC

## 2015-06-06 MED ORDER — AMITRIPTYLINE HCL 50 MG PO TABS: 100.0000 mg | ORAL_TABLET | Freq: Every day | ORAL | Status: DC

## 2015-06-06 MED ORDER — ONDANSETRON 4 MG PO TBDP: 4.0000 mg | ORAL_TABLET | Freq: Three times a day (TID) | ORAL | Status: DC | PRN

## 2015-06-06 MED ORDER — LOPERAMIDE HCL 2 MG PO CAPS: 2.0000 mg | ORAL_CAPSULE | Freq: Two times a day (BID) | ORAL | Status: DC | PRN

## 2015-06-06 MED ORDER — AMLODIPINE BESYLATE 10 MG PO TABS: 5.0000 mg | ORAL_TABLET | Freq: Every day | ORAL | Status: AC

## 2015-06-06 NOTE — Progress Notes (Signed)
Patient discharged to home with instructions. 

## 2015-06-06 NOTE — Discharge Summary (Signed)
Physician Discharge Summary  Patient ID: DAEMIEN BUSSE MRN: [id number] DOB/AGE: 07/08/1980 35 y.o.  Admit date: 06/05/2015 Discharge date: 06/06/2015  Admission Diagnoses: Abdominal pain, LLQ, r/o diverticulitis Hypertension Irritable bowel syndrome Tobbacco use disorder  Discharge Diagnoses:  Principal Problem:   Abdominal pain, acute, left lower quadrant Active Problems:   HTN (hypertension)   Chronic diarrhea   Anxiety disorder   Irritable bowel syndrome   Tobbacco use disorder  Discharged Condition: fair  Hospital Course: 35 year old male has 5 day history of LLQ abdominal pain with nausea. No fever. Also has diarrhea and h/o of irritable bowel syndrome. His CT of abdomen and pelvis showed diverticulosis without infection and previous sigmoid colon inflammation had resolved in interval. He felt better with clear liquids and IV flagyl + IV ciprofloxacin use. He will see his primary doctor or me in 1 month.   Consults: None  Significant Diagnostic Studies: labs: Normal CBC and near normal CMET.   CT abdomen and pelvis with contrast:- unremarkable except colonic diverticulosis.  Treatments: antibiotics: Cipro and metronidazole  Discharge Exam: Blood pressure 107/68, pulse 58, temperature 98.2 F (36.8 C), temperature source Oral, resp. rate 20, height 5\' 11"  (1.803 m), weight 83.009 kg (183 lb), SpO2 96 %. Physical Exam: HEENT: [place]/AT, Blue eyes, wears distant glasses, conj-pink, sclera-white, Midline pink tongue. Neck: No JVD, bruit or thyromegaly. Full range of motion. Lungs: Clear, bilateral. Chest wall tender over sternum.Marland Kitchen Heart:-S1, S2 normal. No murmur. Abdomen:-Soft mild epigastric and LLQ tenderness. Extremities: No edema, cyanosis or clubbing. CNS: Cranial nerves grossly intact. Bilateral equal grips. Skin: Warm and dry.  Disposition: 01-Home or Self Care     Medication List    STOP taking these medications        ibuprofen 800 MG tablet  Commonly known  as:  ADVIL,MOTRIN     loperamide 2 MG capsule  Commonly known as:  IMODIUM      TAKE these medications        amitriptyline 100 MG tablet  Commonly known as:  ELAVIL  Take 1 tablet (100 mg total) by mouth at bedtime.     amLODipine 10 MG tablet  Commonly known as:  NORVASC  Take 0.5 tablets (5 mg total) by mouth daily.     aspirin 81 MG tablet  Take 81 mg by mouth daily.     atorvastatin 20 MG tablet  Commonly known as:  LIPITOR  Take 1 tablet (20 mg total) by mouth daily.     diphenoxylate-atropine 2.5-0.025 MG tablet  Commonly known as:  LOMOTIL  Take 1 tablet by mouth 4 (four) times daily as needed for diarrhea or loose stools.     HYDROcodone-acetaminophen 5-325 MG tablet  Commonly known as:  NORCO/VICODIN  Take 1 tablet by mouth 2 (two) times daily as needed for moderate pain.     methocarbamol 500 MG tablet  Commonly known as:  ROBAXIN  Take 1 tablet (500 mg total) by mouth 3 (three) times daily as needed for muscle spasms (sedation precautions).     metoprolol succinate 25 MG 24 hr tablet  Commonly known as:  TOPROL-XL  TAKE 1 TABLET (25 MG TOTAL) BY MOUTH DAILY.     metroNIDAZOLE 250 MG tablet  Commonly known as:  FLAGYL  Take 1 tablet (250 mg total) by mouth 3 (three) times daily.     ondansetron 4 MG disintegrating tablet  Commonly known as:  ZOFRAN ODT  Take 1 tablet (4 mg total) by mouth every  8 (eight) hours as needed for nausea or vomiting.           Follow-up Information    Follow up with Ria Bush, MD. Schedule an appointment as soon as possible for a visit in 1 month.   Specialty:  Family Medicine   Contact information:   [place] [address] [phone]       Schedule an appointment as soon as possible for a visit with Birdie Riddle, MD.   Specialty:  Cardiology   Why:  As needed   Contact information:   [place] [address] [phone]       Signed: Birdie Riddle 06/06/2015, 4:48 PM

## 2015-06-08 ENCOUNTER — Telehealth: Payer: Self-pay | Admitting: *Deleted

## 2015-06-08 NOTE — Telephone Encounter (Signed)
Transitional care call attempted.  Left message for patient to return call. 

## 2015-06-08 NOTE — Telephone Encounter (Signed)
Transition Care Management Follow-up Telephone Call   Date discharged? 06/06/15   How have you been since you were released from the hospital? Some improvement.   Do you understand why you were in the hospital? yes   Do you understand the discharge instructions? yes   Where were you discharged to? home   Items Reviewed:  Medications reviewed: yes  Allergies reviewed: yes  Dietary changes reviewed: no  Referrals reviewed: no   Functional Questionnaire:   Activities of Daily Living (ADLs):   He states they are independent in the following: ambulation, bathing and hygiene, feeding, continence, grooming, toileting and dressing States they require assistance with the following: none   Any transportation issues/concerns?: no   Any patient concerns? yes, unable to follow up at this time due to lack of insurance (contact information given for [place])   Confirmed importance and date/time of follow-up visits scheduled yes, patient or wife will call back to schedule once financial assistance has been arranged  Confirmed with patient if condition begins to worsen call PCP or go to the ER.  Patient was given the office number and encouraged to call back with question or concerns.  : yes

## 2016-03-12 ENCOUNTER — Telehealth: Payer: Self-pay | Admitting: Radiology

## 2016-03-12 NOTE — Telephone Encounter (Signed)
LMOM for pt to call and update current address. I mailed his Medical Records CD to his known address which came back.

## 2016-10-31 IMAGING — US US ABDOMEN LIMITED
1 series · 14 of 25 positions shown · non-contrast
Comparison: Abdominal CT from earlier the same day.

CLINICAL DATA: Right upper quadrant pain for 1 day

EXAM:
US ABDOMEN LIMITED - RIGHT UPPER QUADRANT

[Series 1: us abdomen limited · 0.21mm/px · 14 of 42 slices shown]
[im 1/42]
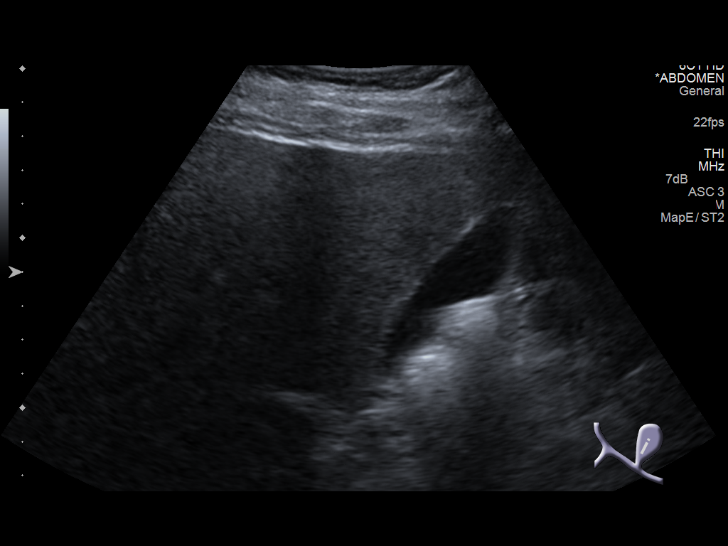
[im 4/42]
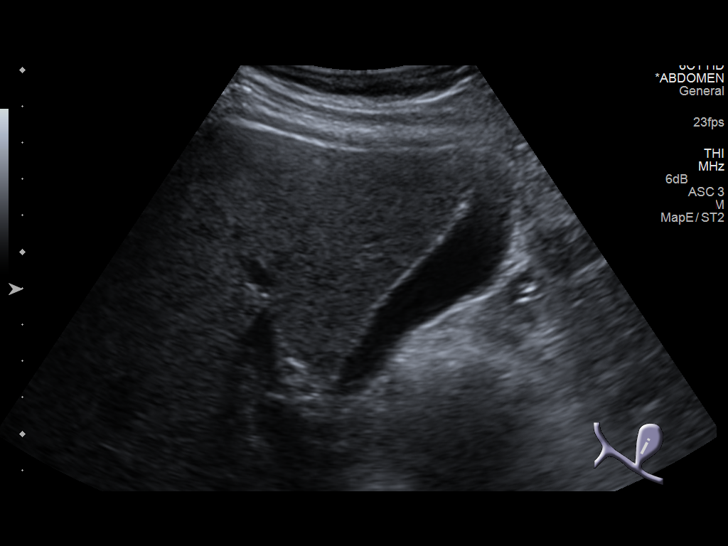
[im 7/42]
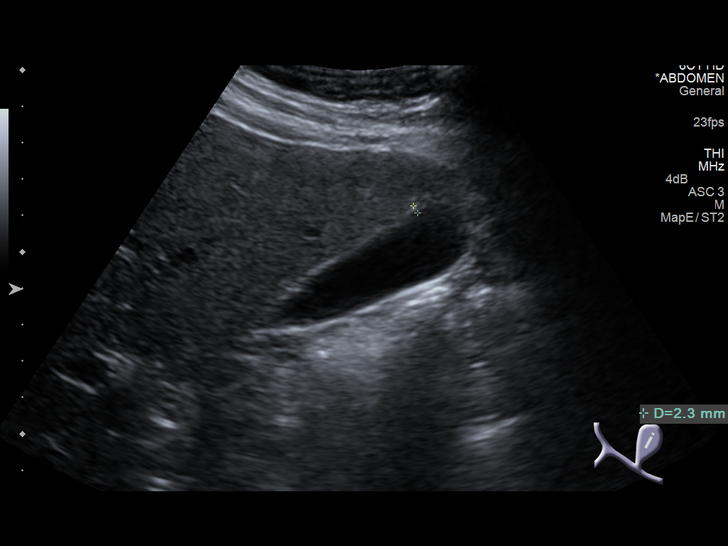
[im 11/42]
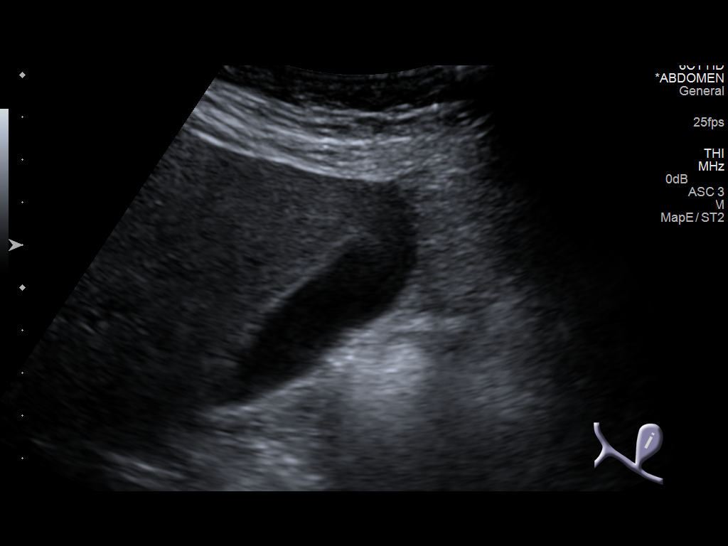
[im 14/42]
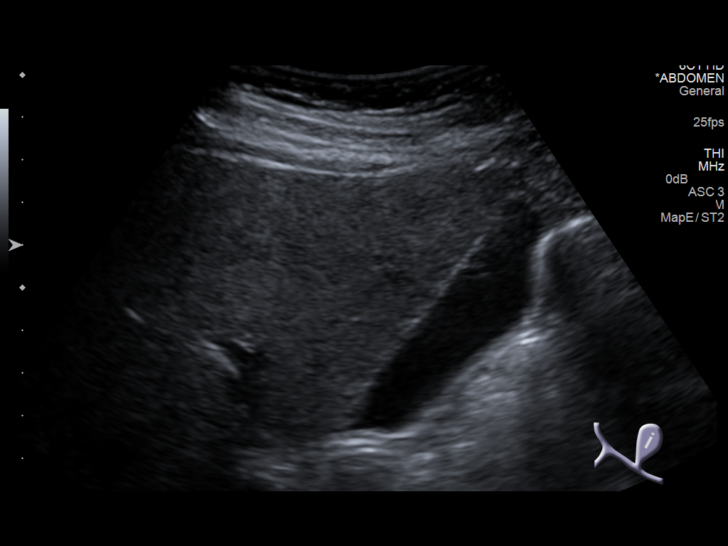
[im 16/42]
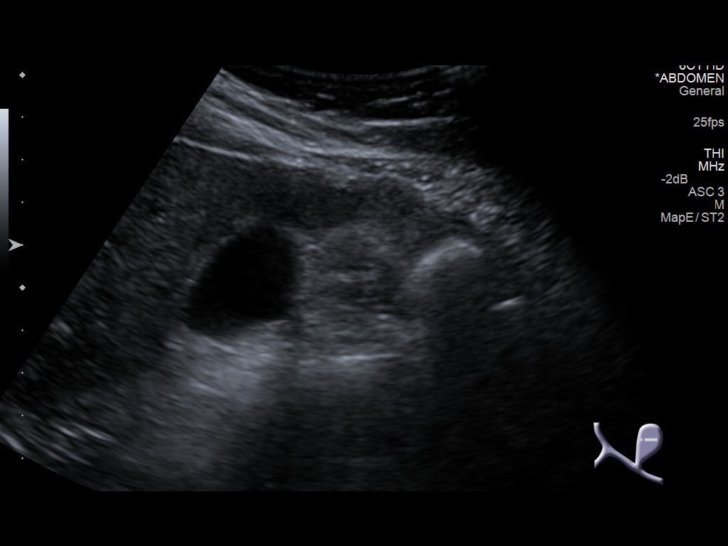
[im 19/42]
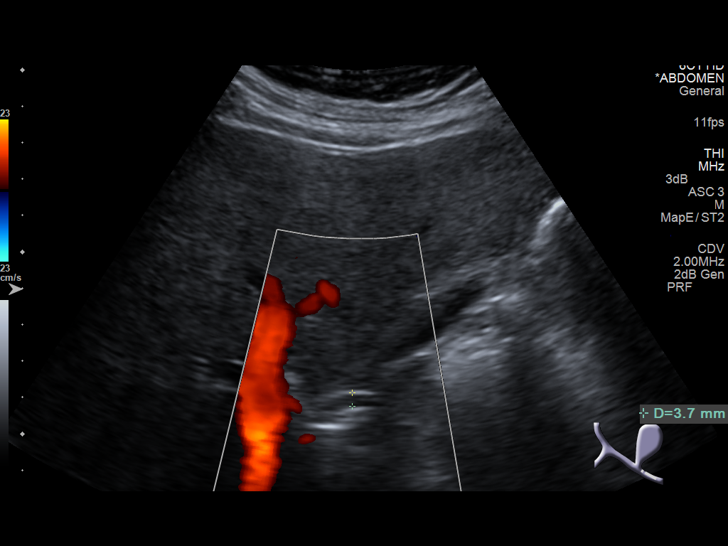
[im 23/42]
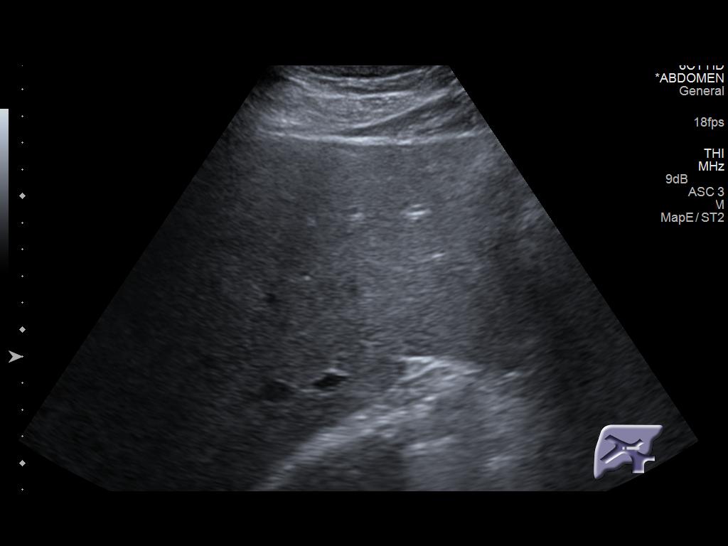
[im 26/42]
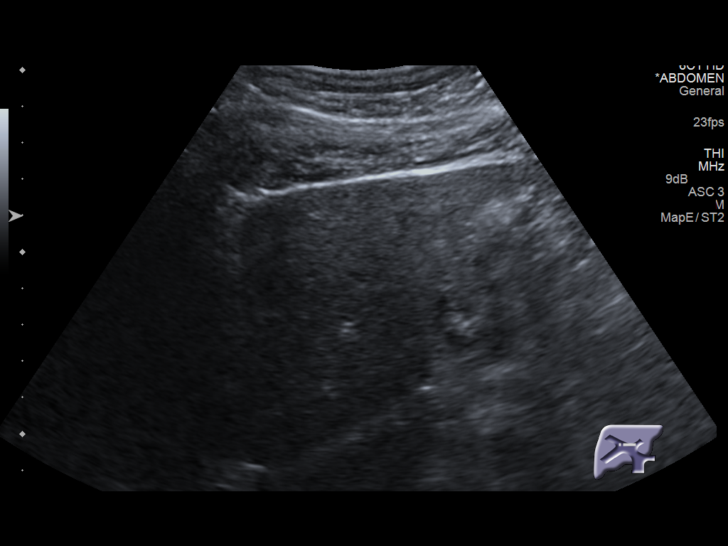
[im 28/42]
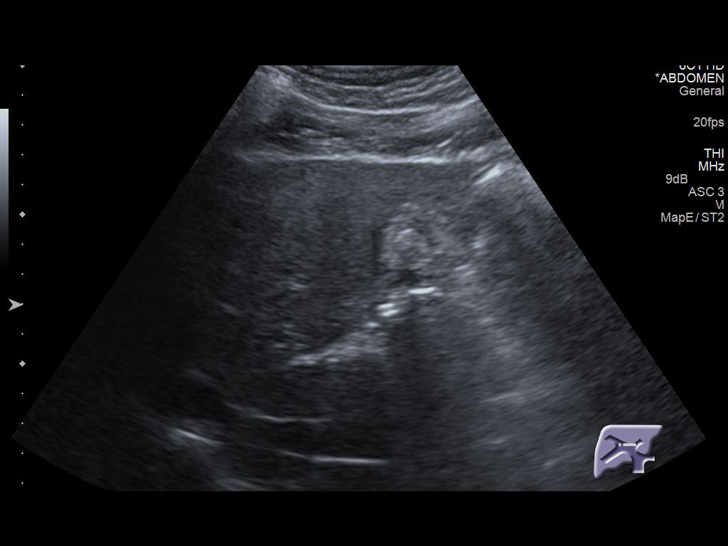
[im 31/42]
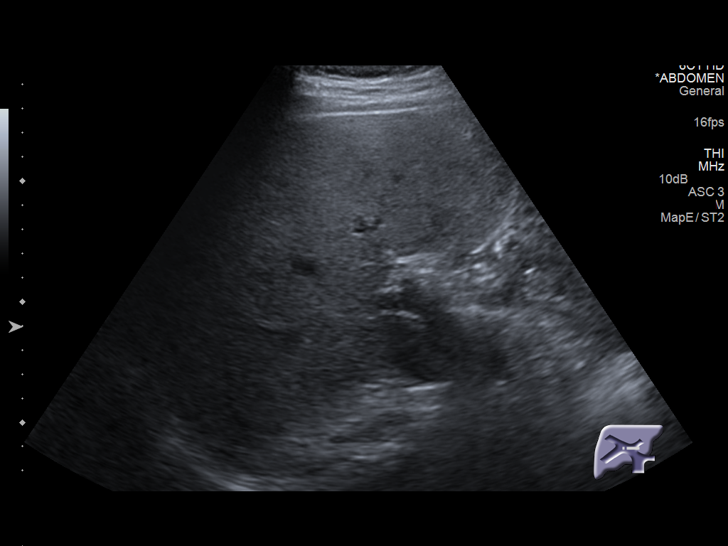
[im 35/42]
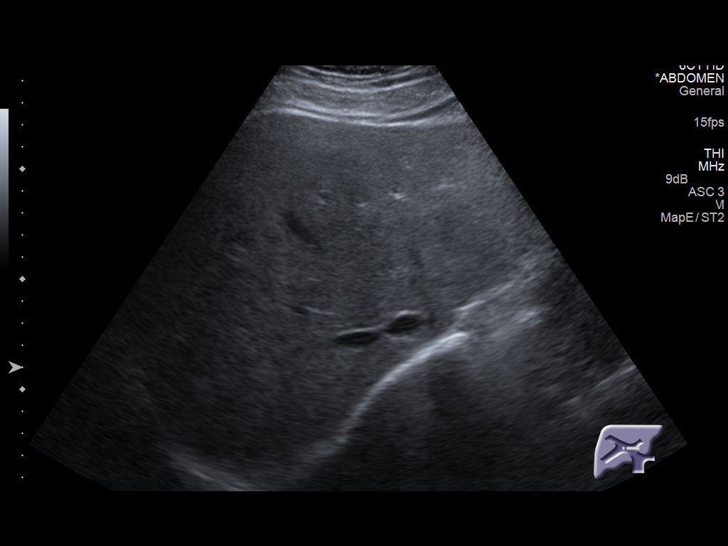
[im 38/42]
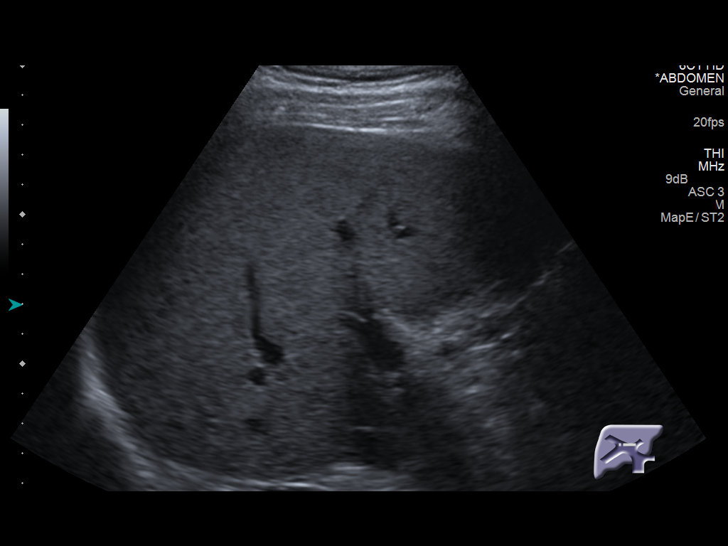
[im 42/42]
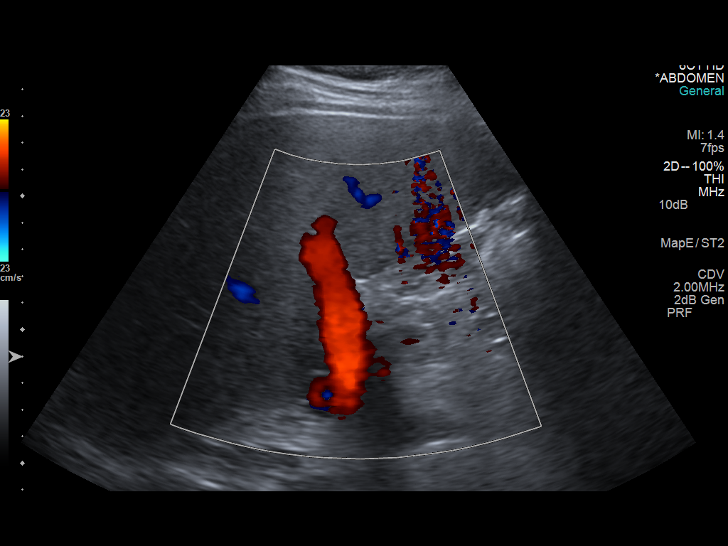

[14 of 25 positions shown; findings below may reference images not displayed]

FINDINGS: Gallbladder:

No gallstones or wall thickening visualized. No sonographic Murphy
sign noted.

Common bile duct:

Diameter: 4 mm

Liver:

No focal lesion identified. Within normal limits in parenchymal
echogenicity. Antegrade flow in the imaged portal venous system.
IMPRESSION: Normal right upper quadrant ultrasound.

## 2017-01-17 IMAGING — CT CT ABD-PELV W/ CM
2 of 4 series · 9 of 46 positions shown, 10 images · IV contrast (Iodine)
Comparison: CT of the abdomen and pelvis from 06/28/2013, and MRI
of the lumbar spine performed 02/24/2014

CLINICAL DATA: Acute onset of right upper quadrant abdominal pain,
nausea and vomiting. Diarrhea. History of ulcerative colitis.
Initial encounter.

EXAM:
CT ABDOMEN AND PELVIS WITH CONTRAST
TECHNIQUE: Multidetector CT imaging of the abdomen and pelvis was performed
using the standard protocol following bolus administration of
intravenous contrast.
CONTRAST:  100mL OMNIPAQUE IOHEXOL 300 MG/ML  SOLN

[Series 201: routine, idose (2) · axial · 0.84mm/px · z∈[+24,+429]mm · 6 of 99 slices shown, 7 images]
[im 9/99  soft-tissue]
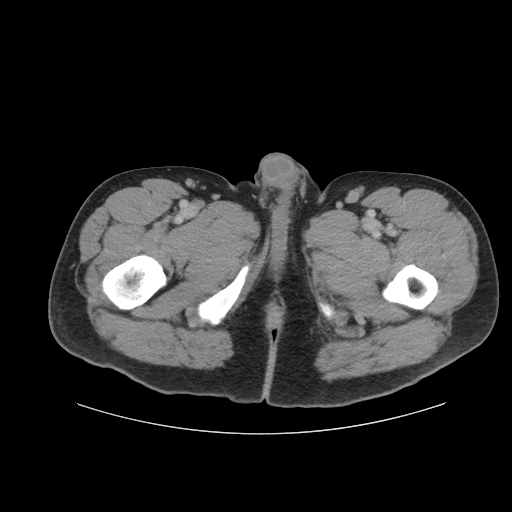
[im 9/99  bone]
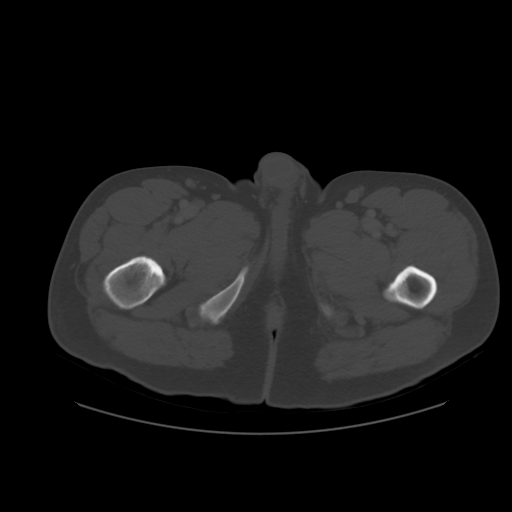
[im 26/99  soft-tissue]
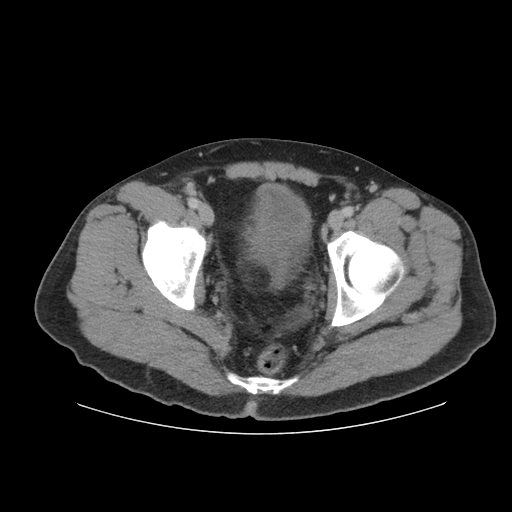
[im 43/99  soft-tissue]
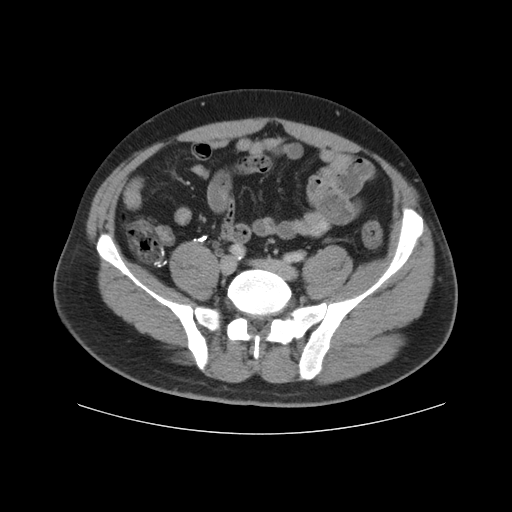
[im 56/99  soft-tissue]
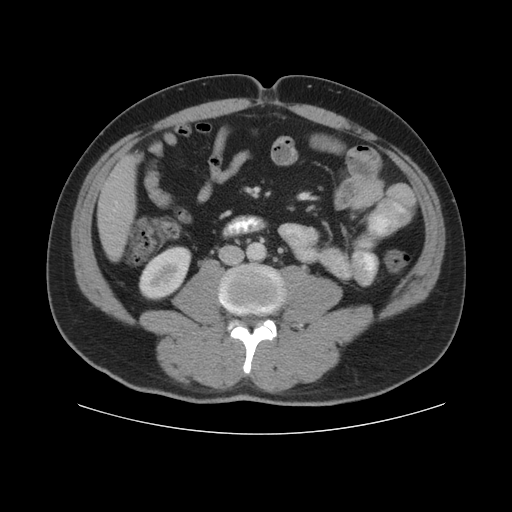
[im 73/99  soft-tissue]
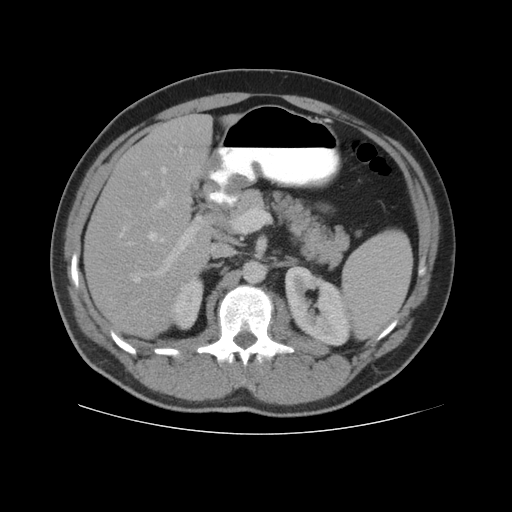
[im 90/99  soft-tissue]
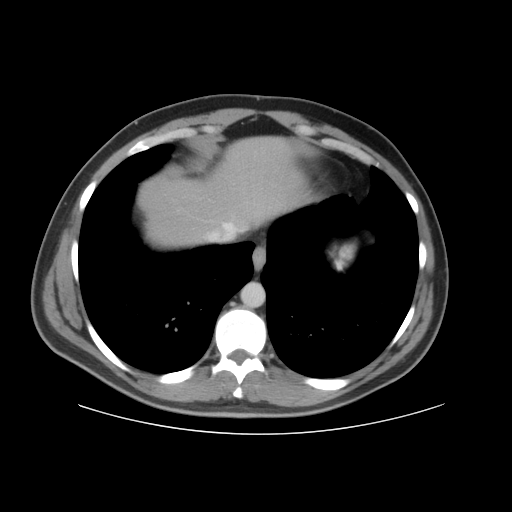

[Series 203: coronals, idose (2) · coronal · 0.45mm/px · 3 of 112 slices shown]
[im 38/112  soft-tissue]
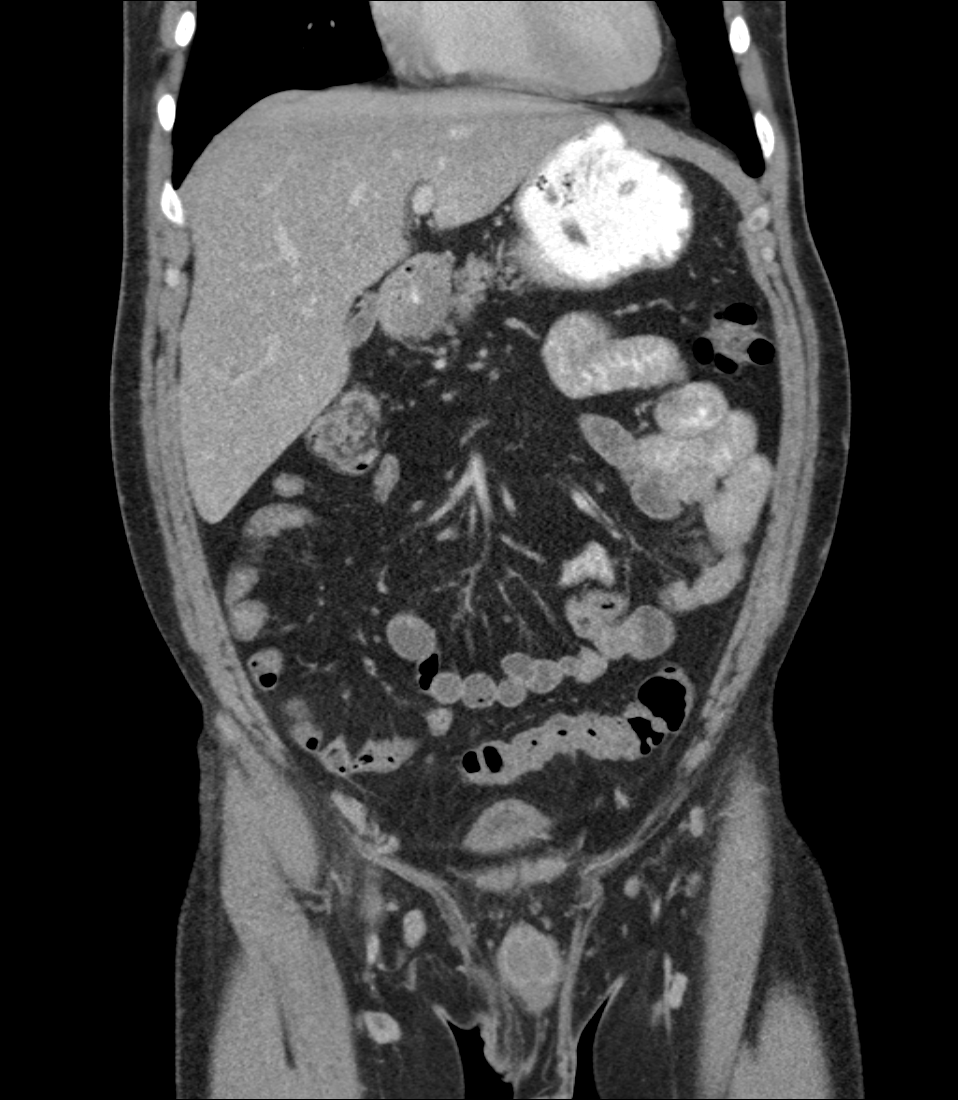
[im 50/112  soft-tissue]
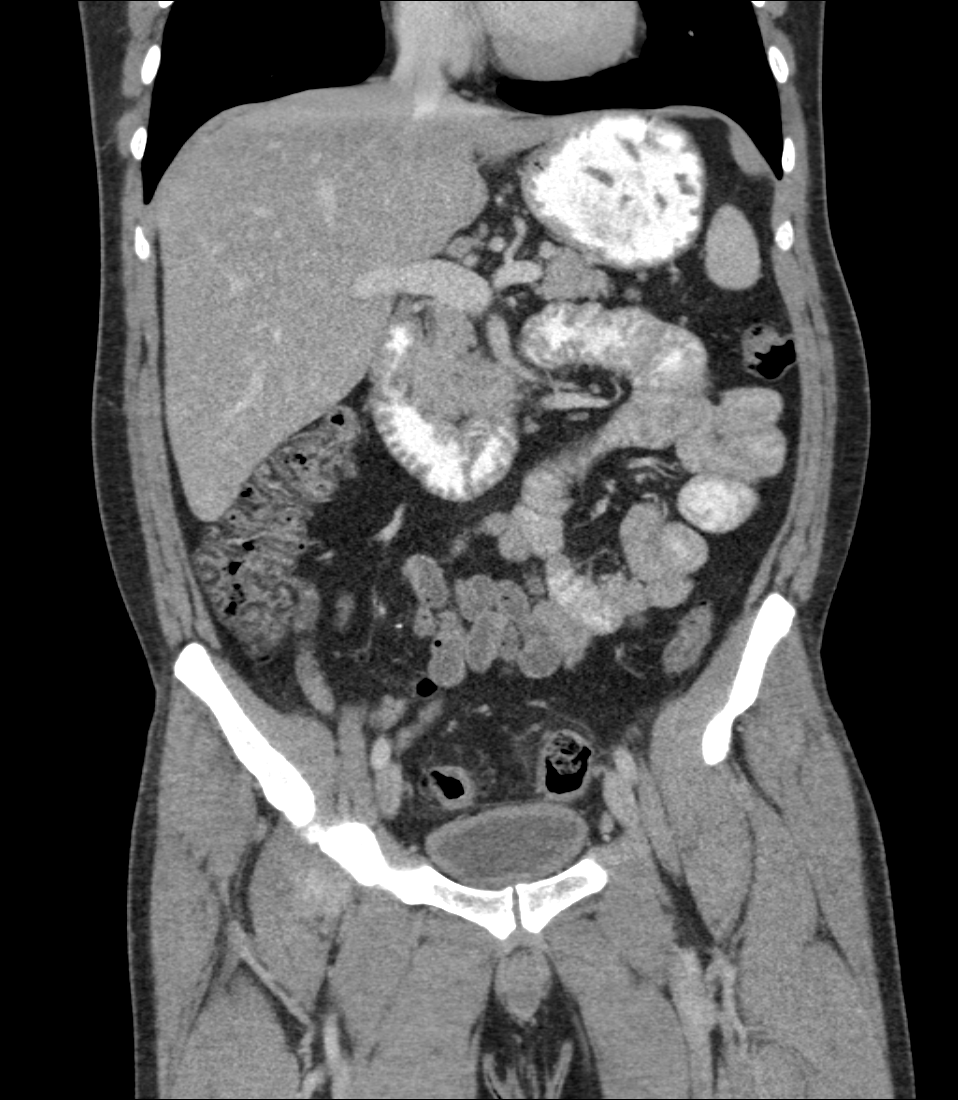
[im 62/112  soft-tissue]
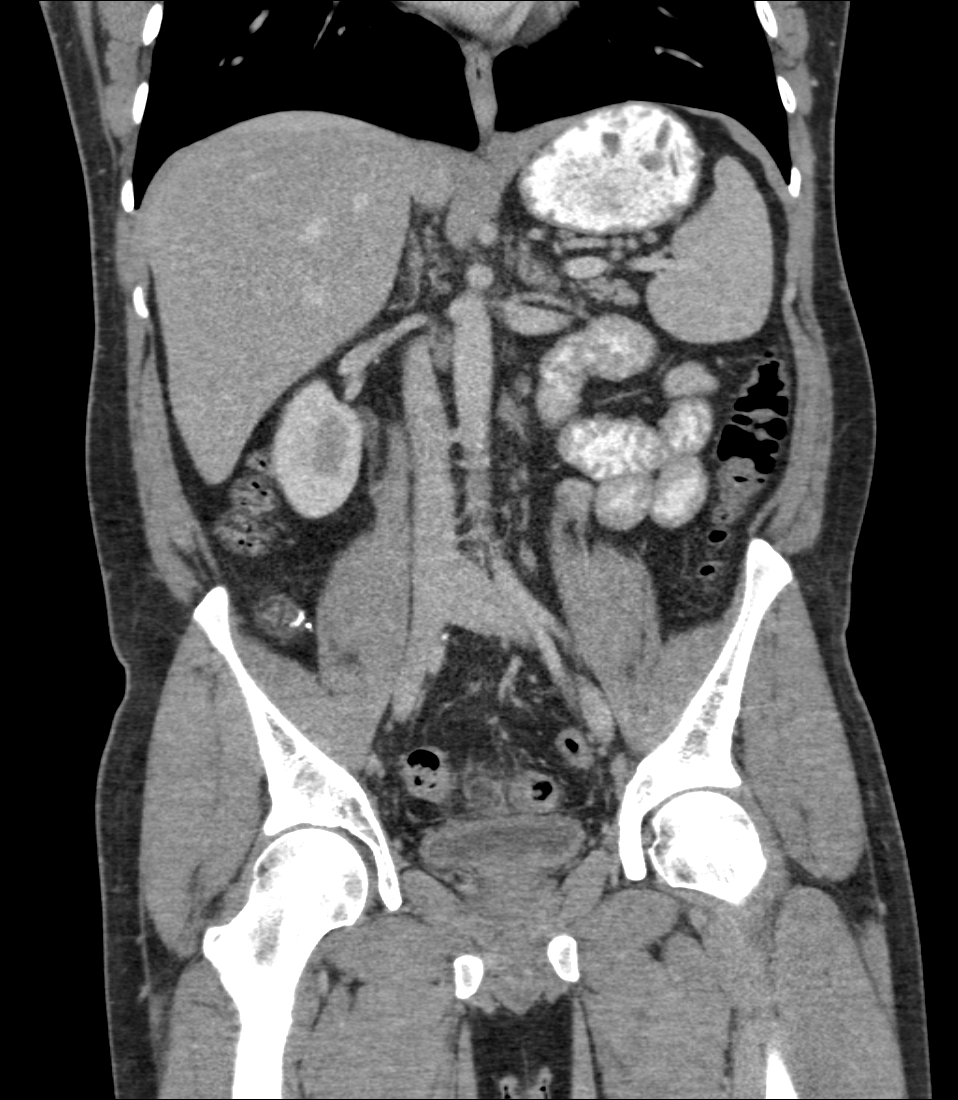

[9 of 46 positions shown; findings below may reference images not displayed]

FINDINGS: The visualized lung bases are clear.

The liver and spleen are unremarkable in appearance. The gallbladder
is within normal limits. The pancreas and adrenal glands are
unremarkable.

The kidneys are unremarkable in appearance. There is no evidence of
hydronephrosis. No renal or ureteral stones are seen. No perinephric
stranding is appreciated.

No free fluid is identified. The small bowel is unremarkable in
appearance. The stomach is within normal limits. No acute vascular
abnormalities are seen.

The patient is status post appendectomy. There is diffuse fatty
infiltration of the wall of the cecum and ascending colon, likely
reflecting sequelae of chronic inflammation.

Focal soft tissue inflammation is noted along the mid to distal
sigmoid colon, with inflammation about an epiploic appendage at the
mid sigmoid colon. This likely reflects the patient's ulcerative
colitis. There is no evidence of perforation or abscess formation at
this time.

The bladder is mildly distended and grossly unremarkable. The
prostate remains normal in size, with minimal calcification. No
inguinal lymphadenopathy is seen.

No acute osseous abnormalities are identified.
IMPRESSION: 1. Focal soft tissue inflammation along the mid to distal sigmoid
colon, with inflammation about an epiploic appendage at the mid
sigmoid colon. This likely reflects the patient's ulcerative
colitis. No evidence of perforation or abscess formation at this
time.
2. Diffuse fatty infiltration of the wall of the cecum and ascending
colon likely reflects sequelae of chronic inflammation.
